# Patient Record
Sex: Male | Born: 1965 | Race: White | Hispanic: No | Marital: Married | Smoking: Never smoker
Health system: Southern US, Community
[De-identification: ages and names within clinical notes are randomized; demographics above are authoritative.]

## PROBLEM LIST (undated history)

## (undated) DIAGNOSIS — M069 Rheumatoid arthritis, unspecified: Secondary | ICD-10-CM

## (undated) DIAGNOSIS — H9190 Unspecified hearing loss, unspecified ear: Secondary | ICD-10-CM

## (undated) DIAGNOSIS — E079 Disorder of thyroid, unspecified: Secondary | ICD-10-CM

## (undated) DIAGNOSIS — R21 Rash and other nonspecific skin eruption: Secondary | ICD-10-CM

## (undated) DIAGNOSIS — E7800 Pure hypercholesterolemia, unspecified: Secondary | ICD-10-CM

## (undated) HISTORY — DX: Rheumatoid arthritis, unspecified: M06.9

## (undated) HISTORY — PX: PROSTATE BIOPSY: SHX241

## (undated) HISTORY — DX: Disorder of thyroid, unspecified: E07.9

## (undated) HISTORY — DX: Pure hypercholesterolemia, unspecified: E78.00

## (undated) HISTORY — DX: Unspecified hearing loss, unspecified ear: H91.90

## (undated) HISTORY — DX: Rash and other nonspecific skin eruption: R21

---

## 2011-07-27 ENCOUNTER — Other Ambulatory Visit: Payer: Self-pay | Admitting: Family Medicine

## 2011-07-27 DIAGNOSIS — E049 Nontoxic goiter, unspecified: Secondary | ICD-10-CM

## 2011-07-30 ENCOUNTER — Ambulatory Visit
Admission: RE | Admit: 2011-07-30 | Discharge: 2011-07-30 | Disposition: A | Discharge status: Home / Self Care | Payer: BC Managed Care – PPO | Source: Ambulatory Visit | Attending: Family Medicine | Admitting: Family Medicine

## 2011-07-30 DIAGNOSIS — E049 Nontoxic goiter, unspecified: Secondary | ICD-10-CM

## 2011-08-04 ENCOUNTER — Other Ambulatory Visit: Payer: Self-pay | Admitting: Family Medicine

## 2011-08-04 DIAGNOSIS — E041 Nontoxic single thyroid nodule: Secondary | ICD-10-CM

## 2011-08-12 ENCOUNTER — Ambulatory Visit
Admission: RE | Admit: 2011-08-12 | Discharge: 2011-08-12 | Disposition: A | Discharge status: Home / Self Care | Payer: BC Managed Care – PPO | Source: Ambulatory Visit | Attending: Family Medicine | Admitting: Family Medicine

## 2011-08-12 ENCOUNTER — Other Ambulatory Visit (HOSPITAL_COMMUNITY)
Admission: RE | Admit: 2011-08-12 | Discharge: 2011-08-12 | Disposition: A | Discharge status: Home / Self Care | Payer: BC Managed Care – PPO | Source: Ambulatory Visit | Attending: Radiology | Admitting: Radiology

## 2011-08-12 DIAGNOSIS — E041 Nontoxic single thyroid nodule: Secondary | ICD-10-CM

## 2011-08-12 DIAGNOSIS — E049 Nontoxic goiter, unspecified: Secondary | ICD-10-CM | POA: Insufficient documentation

## 2011-08-12 NOTE — Procedures (Signed)
US guided needle aspirate biopsy performed of dominant left thyroid nodule x4 via 25 gauge needles. No immediate complications. Pathology pending.

## 2011-09-01 ENCOUNTER — Encounter (INDEPENDENT_AMBULATORY_CARE_PROVIDER_SITE_OTHER): Payer: Self-pay | Admitting: Surgery

## 2011-09-13 ENCOUNTER — Encounter (INDEPENDENT_AMBULATORY_CARE_PROVIDER_SITE_OTHER): Payer: Self-pay | Admitting: Surgery

## 2011-09-13 ENCOUNTER — Ambulatory Visit (INDEPENDENT_AMBULATORY_CARE_PROVIDER_SITE_OTHER): Payer: BC Managed Care – PPO | Admitting: Surgery

## 2011-09-13 VITALS — BP 122/88 | HR 66 | Temp 97.2°F | Ht 74.0 in | Wt 205.0 lb

## 2011-09-13 DIAGNOSIS — E041 Nontoxic single thyroid nodule: Secondary | ICD-10-CM

## 2011-09-13 NOTE — Patient Instructions (Signed)
Call if you decide to schedule surgery to remove your thyroid gland or if you have any other questions

## 2011-09-13 NOTE — Progress Notes (Signed)
NAME: Christian Gentry                                                                                      DOB: 07/19/1966 DATE: 09/13/2011               MRN: [id number]   CC:  Chief Complaint  Patient presents with  . Thyroid Nodule    eval thyroid nodule    HPI:  Christian Collen Vincent is a 45 y.o.  male who was referred  by Dr. Coralyn Helling evaluation of A. 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 The patient has been asymptomatic. His sister apparently had a thyroid nodule removed surgically and with his mother was here to visit noted the patient had a mass in the left lobe of the thyroid. The patient was unaware of that and has had no symptoms. He had an ultrasound done showing a complex mass and fine-needle aspiration biopsy showing what appears to be a follicular tumor although could be Hurthle cell and also could have a papillary thyroid cancer. However most likely thought to be a benign follicular tumor.  PMH:  has a past medical history of Hypercholesteremia; Thyroid disease; Rash; and Hearing difficulty.  PSH:   has no past surgical history on file.  ALLERGIES:  No Known Allergies  MEDICATIONS: No current outpatient prescriptions on file.  ROS: This 12 point review of systems basically negative except for a rash he had a few weeks ago and some hearing here congestion but has not resolved. He also is known to have high cholesterol.  EXAM:   GENERAL:  The patient is alert, oriented, and generally healthy-appearing, NAD. Mood and affect are normal.  HEENT:  The head is normocephalic, the eyes nonicteric, the pupils were round regular and equal. EOMs are normal. Pharynx normal. Dentition good.  NECK:  The neck is supple.There is a 3 cm round firm but not hard mass of the left lobe of the thyroid gland. It is nontender. It moves easily when he swallows. The remainder of the thyroid gland is normal. There is no cervical adenopathy noted.  LUNGS:   Normal respirations and clear to auscultation.  HEART:  Regular rhythm, with no murmurs rubs or gallops. Pulses are intact carotid dorsalis pedis and posterior tibial. No significant varicosities are noted.  ABDOMEN:  Soft, flat, and nontender. No masses or organomegaly is noted. No hernias are noted. Bowel sounds are normal.    EXTREMITIES:  Good range of motion, no edema.   DATA REVIEWED:  I have reviewed the thyroid ultrasound and report as well as his pathology report.  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 I recommended that he consider thyroid lobectomy. We cannot be sure it is not malignant although it is more likely to be benign. However the indications for surgery risks and complications including bleeding, infection, nerve damage, and loss of parathyroid function were all explained. I explained that he would probably be in the hospital overnight and that should be able to  return to normal activities within a few weeks.  I told an alternative would be to do a 4-6 month followup ultrasound and continue interval ultrasound exams to be sure this is not enlarging. I also told him that if this were malignant he would most likely need a total thyroidectomy and possibly radioactive iodine therapy.  I think all questions have been answered. He and his wife wanted to think over the issues before making a decision. I told him to call for any questions or if they wished to schedule surgery.  Ashton Sabine J 09/13/2011  CC: Halina Maidens., MD, Warrick Parisian, MD

## 2011-09-15 ENCOUNTER — Other Ambulatory Visit (INDEPENDENT_AMBULATORY_CARE_PROVIDER_SITE_OTHER): Payer: Self-pay | Admitting: Surgery

## 2011-09-15 ENCOUNTER — Telehealth (INDEPENDENT_AMBULATORY_CARE_PROVIDER_SITE_OTHER): Payer: Self-pay | Admitting: Surgery

## 2011-09-16 NOTE — Telephone Encounter (Signed)
Have you written orders on her?

## 2011-09-17 NOTE — Telephone Encounter (Signed)
Per Dr Jamey Ripa orders have been written and sent to the scheduling department.

## 2011-09-21 ENCOUNTER — Encounter (INDEPENDENT_AMBULATORY_CARE_PROVIDER_SITE_OTHER): Payer: Self-pay

## 2011-10-07 ENCOUNTER — Encounter (HOSPITAL_COMMUNITY): Payer: Self-pay

## 2011-10-15 ENCOUNTER — Ambulatory Visit (HOSPITAL_COMMUNITY)
Admission: RE | Admit: 2011-10-15 | Discharge: 2011-10-15 | Disposition: A | Discharge status: Home / Self Care | Payer: BC Managed Care – PPO | Source: Ambulatory Visit | Attending: Surgery | Admitting: Surgery

## 2011-10-15 ENCOUNTER — Encounter (HOSPITAL_COMMUNITY)
Admission: RE | Admit: 2011-10-15 | Discharge: 2011-10-15 | Disposition: A | Discharge status: Home / Self Care | Payer: BC Managed Care – PPO | Source: Ambulatory Visit | Attending: Surgery | Admitting: Surgery

## 2011-10-15 ENCOUNTER — Encounter (HOSPITAL_COMMUNITY): Payer: Self-pay

## 2011-10-15 DIAGNOSIS — E041 Nontoxic single thyroid nodule: Secondary | ICD-10-CM | POA: Insufficient documentation

## 2011-10-15 DIAGNOSIS — I10 Essential (primary) hypertension: Secondary | ICD-10-CM | POA: Insufficient documentation

## 2011-10-15 DIAGNOSIS — Z01818 Encounter for other preprocedural examination: Secondary | ICD-10-CM | POA: Insufficient documentation

## 2011-10-15 DIAGNOSIS — Z01812 Encounter for preprocedural laboratory examination: Secondary | ICD-10-CM | POA: Insufficient documentation

## 2011-10-15 LAB — SURGICAL PCR SCREEN
MRSA, PCR: NEGATIVE
Staphylococcus aureus: POSITIVE — AB

## 2011-10-15 LAB — CBC
MCH: 29.4 pg (ref 26.0–34.0)
Platelets: 184 10*3/uL (ref 150–400)
RBC: 4.93 MIL/uL (ref 4.22–5.81)

## 2011-10-15 NOTE — Patient Instructions (Signed)
20 Jaap-Jan Sargon Scouten  10/15/2011   Your procedure is scheduled on:  10/21/11 1130am-100pm  Report to [facility] at 0930 AM.  Call this number if you have problems the morning of surgery: [phone]   Remember:   Do not eat food:After Midnight.  May have clear liquids:until Midnight .    Take these medicines the morning of surgery with A SIP OF WATER:    Do not wear jewelry,   Do not wear lotions, powders, or perfumes.     Do not bring valuables to the hospital.  Contacts, dentures or bridgework may not be worn into surgery.  Leave suitcase in the car. After surgery it may be brought to your room.  For patients admitted to the hospital, checkout time is 11:00 AM the day of discharge.   Marland Kitchen    Special Instructions: CHG Shower Use Special Wash: 1/2 bottle night before surgery and 1/2 bottle morning of surgery. shower chin to toes Wash face and private parts with regular soap.     Please read over the following fact sheets that you were given: MRSA Information, coughing and deep breathing exercises, leg exercises

## 2011-10-21 ENCOUNTER — Encounter (HOSPITAL_COMMUNITY): Payer: Self-pay | Admitting: Anesthesiology

## 2011-10-21 ENCOUNTER — Ambulatory Visit (HOSPITAL_COMMUNITY): Payer: BC Managed Care – PPO | Admitting: Anesthesiology

## 2011-10-21 ENCOUNTER — Encounter (HOSPITAL_COMMUNITY)
Admission: RE | Disposition: A | Discharge status: Home / Self Care | Payer: Self-pay | Source: Ambulatory Visit | Attending: Surgery

## 2011-10-21 ENCOUNTER — Ambulatory Visit (HOSPITAL_COMMUNITY)
Admission: RE | Admit: 2011-10-21 | Discharge: 2011-10-22 | Disposition: A | Discharge status: Home / Self Care | Payer: BC Managed Care – PPO | Source: Ambulatory Visit | Attending: Surgery | Admitting: Surgery

## 2011-10-21 ENCOUNTER — Encounter (HOSPITAL_COMMUNITY): Payer: Self-pay | Admitting: *Deleted

## 2011-10-21 ENCOUNTER — Other Ambulatory Visit (INDEPENDENT_AMBULATORY_CARE_PROVIDER_SITE_OTHER): Payer: Self-pay | Admitting: Surgery

## 2011-10-21 DIAGNOSIS — E041 Nontoxic single thyroid nodule: Secondary | ICD-10-CM | POA: Insufficient documentation

## 2011-10-21 DIAGNOSIS — D34 Benign neoplasm of thyroid gland: Secondary | ICD-10-CM

## 2011-10-21 DIAGNOSIS — E7800 Pure hypercholesterolemia, unspecified: Secondary | ICD-10-CM | POA: Insufficient documentation

## 2011-10-21 DIAGNOSIS — Z01812 Encounter for preprocedural laboratory examination: Secondary | ICD-10-CM | POA: Insufficient documentation

## 2011-10-21 DIAGNOSIS — R21 Rash and other nonspecific skin eruption: Secondary | ICD-10-CM | POA: Insufficient documentation

## 2011-10-21 DIAGNOSIS — H9190 Unspecified hearing loss, unspecified ear: Secondary | ICD-10-CM | POA: Insufficient documentation

## 2011-10-21 HISTORY — PX: THYROID LOBECTOMY: SHX420

## 2011-10-21 SURGERY — LOBECTOMY, THYROID
Anesthesia: General | Site: Abdomen | Laterality: Left | Wound class: Clean

## 2011-10-21 MED ORDER — MEPERIDINE HCL 50 MG/ML IJ SOLN: 6.2500 mg | INTRAMUSCULAR | Status: DC | PRN

## 2011-10-21 MED ORDER — GLYCOPYRROLATE 0.2 MG/ML IJ SOLN
INTRAMUSCULAR | Status: DC | PRN
  Administered 2011-10-21: 0.1 mg via INTRAVENOUS

## 2011-10-21 MED ORDER — MIDAZOLAM HCL 5 MG/5ML IJ SOLN
INTRAMUSCULAR | Status: DC | PRN
  Administered 2011-10-21: 2 mg via INTRAVENOUS

## 2011-10-21 MED ORDER — HYDROMORPHONE HCL PF 1 MG/ML IJ SOLN
2.0000 mg | INTRAMUSCULAR | Status: DC | PRN
Start: 2011-10-21 — End: 2011-10-22
  Administered 2011-10-21: 2 mg via INTRAVENOUS
  Filled 2011-10-21: qty 1

## 2011-10-21 MED ORDER — LACTATED RINGERS IV SOLN
INTRAVENOUS | Status: DC
  Administered 2011-10-21: 1000 mL via INTRAVENOUS

## 2011-10-21 MED ORDER — DEXAMETHASONE SODIUM PHOSPHATE 10 MG/ML IJ SOLN
INTRAMUSCULAR | Status: DC | PRN
  Administered 2011-10-21: 10 mg via INTRAVENOUS

## 2011-10-21 MED ORDER — SCOPOLAMINE 1 MG/3DAYS TD PT72
MEDICATED_PATCH | TRANSDERMAL | Status: DC | PRN
  Administered 2011-10-21: 1 via TRANSDERMAL

## 2011-10-21 MED ORDER — SUCCINYLCHOLINE CHLORIDE 20 MG/ML IJ SOLN
INTRAMUSCULAR | Status: DC | PRN
  Administered 2011-10-21: 100 mg via INTRAVENOUS

## 2011-10-21 MED ORDER — FENTANYL CITRATE 0.05 MG/ML IJ SOLN
INTRAMUSCULAR | Status: DC | PRN
  Administered 2011-10-21: 25 ug via INTRAVENOUS
  Administered 2011-10-21: 50 ug via INTRAVENOUS
  Administered 2011-10-21: 25 ug via INTRAVENOUS
  Administered 2011-10-21 (×2): 50 ug via INTRAVENOUS

## 2011-10-21 MED ORDER — OXYCODONE-ACETAMINOPHEN 5-325 MG PO TABS
1.0000 | ORAL_TABLET | ORAL | Status: DC | PRN
  Administered 2011-10-22: 2 via ORAL
  Filled 2011-10-21: qty 2

## 2011-10-21 MED ORDER — PROMETHAZINE HCL 25 MG/ML IJ SOLN: 6.2500 mg | INTRAMUSCULAR | Status: DC | PRN

## 2011-10-21 MED ORDER — ONDANSETRON HCL 4 MG PO TABS: 4.0000 mg | ORAL_TABLET | Freq: Four times a day (QID) | ORAL | Status: DC | PRN

## 2011-10-21 MED ORDER — ACETAMINOPHEN 10 MG/ML IV SOLN
INTRAVENOUS | Status: DC | PRN
  Administered 2011-10-21: 1000 mg via INTRAVENOUS

## 2011-10-21 MED ORDER — KCL IN DEXTROSE-NACL 20-5-0.45 MEQ/L-%-% IV SOLN
INTRAVENOUS | Status: DC
  Administered 2011-10-21: 20:00:00 via INTRAVENOUS
  Filled 2011-10-21 (×4): qty 1000

## 2011-10-21 MED ORDER — ONDANSETRON HCL 4 MG/2ML IJ SOLN: 4.0000 mg | Freq: Four times a day (QID) | INTRAMUSCULAR | Status: DC | PRN

## 2011-10-21 MED ORDER — PROPOFOL 10 MG/ML IV EMUL
INTRAVENOUS | Status: DC | PRN
  Administered 2011-10-21: 200 mg via INTRAVENOUS

## 2011-10-21 MED ORDER — DROPERIDOL 2.5 MG/ML IJ SOLN
INTRAMUSCULAR | Status: DC | PRN
  Administered 2011-10-21: 0.625 mg via INTRAVENOUS

## 2011-10-21 MED ORDER — LACTATED RINGERS IV SOLN: INTRAVENOUS | Status: DC

## 2011-10-21 MED ORDER — CEFAZOLIN SODIUM-DEXTROSE 2-3 GM-% IV SOLR
2.0000 g | INTRAVENOUS | Status: AC
  Administered 2011-10-21: 2 g via INTRAVENOUS

## 2011-10-21 MED ORDER — HYDROMORPHONE HCL PF 1 MG/ML IJ SOLN: 0.2500 mg | INTRAMUSCULAR | Status: DC | PRN

## 2011-10-21 SURGICAL SUPPLY — 43 items
ATTRACTOMAT 16X20 MAGNETIC DRP (DRAPES) ×2 IMPLANT
BENZOIN TINCTURE PRP APPL 2/3 (GAUZE/BANDAGES/DRESSINGS) ×2 IMPLANT
BLADE HEX COATED 2.75 (ELECTRODE) ×2 IMPLANT
BLADE SURG 15 STRL LF DISP TIS (BLADE) ×2 IMPLANT
BLADE SURG 15 STRL SS (BLADE) ×2
CANISTER SUCTION 2500CC (MISCELLANEOUS) ×2 IMPLANT
CHLORAPREP W/TINT 10.5 ML (MISCELLANEOUS) ×2 IMPLANT
CLIP TI LARGE 6 (CLIP) ×6 IMPLANT
CLIP TI WIDE RED SMALL 6 (CLIP) ×4 IMPLANT
CLOTH BEACON ORANGE TIMEOUT ST (SAFETY) ×2 IMPLANT
DISSECTOR ROUND CHERRY 3/8 STR (MISCELLANEOUS) ×2 IMPLANT
DRAIN PENROSE 18X1/4 LTX STRL (WOUND CARE) IMPLANT
DRAPE PED LAPAROTOMY (DRAPES) ×2 IMPLANT
ELECT REM PT RETURN 9FT ADLT (ELECTROSURGICAL) ×2
ELECTRODE REM PT RTRN 9FT ADLT (ELECTROSURGICAL) ×1 IMPLANT
GAUZE SPONGE 4X4 12PLY STRL LF (GAUZE/BANDAGES/DRESSINGS) ×2 IMPLANT
GAUZE SPONGE 4X4 16PLY XRAY LF (GAUZE/BANDAGES/DRESSINGS) ×2 IMPLANT
GLOVE BIOGEL PI IND STRL 7.0 (GLOVE) ×1 IMPLANT
GLOVE BIOGEL PI INDICATOR 7.0 (GLOVE) ×1
GLOVE EUDERMIC 7 POWDERFREE (GLOVE) ×2 IMPLANT
GOWN STRL NON-REIN LRG LVL3 (GOWN DISPOSABLE) ×2 IMPLANT
GOWN STRL REIN XL XLG (GOWN DISPOSABLE) ×4 IMPLANT
HEMOSTAT SURGICEL 2X14 (HEMOSTASIS) IMPLANT
HEMOSTAT SURGICEL 2X4 FIBR (HEMOSTASIS) ×2 IMPLANT
KIT BASIN OR (CUSTOM PROCEDURE TRAY) ×2 IMPLANT
NS IRRIG 1000ML POUR BTL (IV SOLUTION) ×2 IMPLANT
PACK BASIC VI WITH GOWN DISP (CUSTOM PROCEDURE TRAY) ×2 IMPLANT
PENCIL BUTTON HOLSTER BLD 10FT (ELECTRODE) ×2 IMPLANT
SHEARS HARMONIC 9CM CVD (BLADE) ×2 IMPLANT
SPONGE GAUZE 4X4 12PLY (GAUZE/BANDAGES/DRESSINGS) ×2 IMPLANT
STAPLER VISISTAT 35W (STAPLE) ×2 IMPLANT
STRIP CLOSURE SKIN 1/2X4 (GAUZE/BANDAGES/DRESSINGS) ×2 IMPLANT
SUT MNCRL AB 4-0 PS2 18 (SUTURE) ×2 IMPLANT
SUT SILK 2 0 (SUTURE) ×1
SUT SILK 2-0 18XBRD TIE 12 (SUTURE) ×1 IMPLANT
SUT SILK 3 0 (SUTURE) ×1
SUT SILK 3-0 18XBRD TIE 12 (SUTURE) ×1 IMPLANT
SUT VIC AB 3-0 SH 18 (SUTURE) ×4 IMPLANT
SYR BULB IRRIGATION 50ML (SYRINGE) ×2 IMPLANT
TAPE CLOTH SURG 4X10 WHT LF (GAUZE/BANDAGES/DRESSINGS) ×2 IMPLANT
TOWEL OR 17X26 10 PK STRL BLUE (TOWEL DISPOSABLE) ×2 IMPLANT
WATER STERILE IRR 1500ML POUR (IV SOLUTION) ×2 IMPLANT
YANKAUER SUCT BULB TIP 10FT TU (MISCELLANEOUS) ×2 IMPLANT

## 2011-10-21 NOTE — H&P (Signed)
NAME: Christian Gentry DOB: 1965-09-18  DATE: 09/13/2011 MRN: [id number]  CC:  Chief Complaint Thyroid nodule           HPI: Christian Gentry is a 46 y.o. male who was referred by Dr. Coralyn Helling evaluation of A. 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 The patient has been asymptomatic. His sister apparently had a thyroid nodule removed surgically and with his mother was here to visit noted the patient had a mass in the left lobe of the thyroid. The patient was unaware of that and has had no symptoms. He had an ultrasound done showing a complex mass and fine-needle aspiration biopsy showing what appears to be a follicular tumor although could be Hurthle cell and also could have a papillary thyroid cancer. However most likely thought to be a benign follicular tumor.  PMH: has a past medical history of Hypercholesteremia; Thyroid disease; Rash; and Hearing difficulty.  PSH: has no past surgical history on file.  ALLERGIES: No Known Allergies  MEDICATIONS: No current outpatient prescriptions on file.  ROS: This 12 point review of systems basically negative except for a rash he had a few weeks ago and some hearing here congestion but has not resolved. He also is known to have high cholesterol.  EXAM:  BP 124/81  Pulse 72  Temp 98.4 F (36.9 C)  Resp 20  SpO2 99%  GENERAL:  The patient is alert, oriented, and generally healthy-appearing, NAD. Mood and affect are normal.  HEENT:  The head is normocephalic, the eyes nonicteric, the pupils were round regular and equal. EOMs are normal. Pharynx normal. Dentition good.  NECK:  The neck is supple.There is a 3 cm round firm but not hard mass of the left lobe of the thyroid gland. It is nontender. It moves easily when he swallows. The remainder of the thyroid gland is normal. There is no cervical adenopathy noted.  LUNGS:  Normal respirations and clear to auscultation.  HEART:  Regular rhythm, with no murmurs rubs  or gallops. Pulses are intact carotid dorsalis pedis and posterior tibial. No significant varicosities are noted.  ABDOMEN:  Soft, flat, and nontender. No masses or organomegaly is noted. No hernias are noted. Bowel sounds are normal.  EXTREMITIES:  Good range of motion, no edema.  DATA REVIEWED: I have reviewed the thyroid ultrasound and report as well as his pathology report.  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 I have reviewed the plans with him again and answered questions.

## 2011-10-21 NOTE — Transfer of Care (Signed)
Immediate Anesthesia Transfer of Care Note  Patient: Christian Gentry  Procedure(s) Performed:  THYROID LOBECTOMY - Removal left lobe of thyroid  Patient Location: PACU  Anesthesia Type: General  Level of Consciousness: sedated, patient cooperative and responds to stimulaton  Airway & Oxygen Therapy: Patient Spontanous Breathing and Patient connected to face mask oxgen  Post-op Assessment: Report given to PACU RN and Post -op Vital signs reviewed and stable  Post vital signs: Reviewed and stable  Complications: No apparent anesthesia complications

## 2011-10-21 NOTE — Anesthesia Procedure Notes (Signed)
Performed by: Arion Shankles JOHNSON     

## 2011-10-21 NOTE — Op Note (Signed)
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 Viviann Spare gross  Anesthesia: General   Clinical History and Indications: This patient was found to have a nodule of the left lobe of the thyroid and on fine needle aspiration biopsy it appeared to be a follicular lesion. After discussion of alternatives he elected to proceed to left thyroid lobectomy. We discussed the fact that he might have to have a total thyroidectomy at some point if his final pathology showed carcinoma of the thyroid.    Description of Procedure: I saw the patient in the preoperative area and reviewed the plans with him again. I examined him as well. There is a palpable nodule in the left lobe of the thyroid.  The patient was taken to the operating room and after satisfactory general anesthesia had been obtained the neck area was prepped and draped in the time out was done. A transverse cervical incision was made and deepened through the platysma muscle. Subplatysmal flaps were raised in the usual fashion and a self-retaining retractor placed.  The midline fascia was opened and the left side strap muscles elevated off of the thyroid gland. Some small vessels coming to the inferior pole medially were divided. The superior pole vessels were then individually dissected out and ligated either with ties or clips. This gave good mobility of the thyroid gland slightly rotated medially.  Small vessels as they entered the thyroid were divided either using the harmonic scalpel or clips. I stayed in the surgical plane of the thyroid and away from the nerve. I saw an inferior parathyroid gland and what I thought was a superior parathyroid gland both of which were preserved. I was able then to free the thyroid off of the  trachea and rotated medially and beyond the isthmus over to the right side. The thyroid gland was then divided at the isthmus with the harmonic and passed off as a specimen.  I then irrigated and made sure everything was dry. I had not initially seen and nervous I thought it was under some fatty tissue and dissected in their little bit to identify it and be certain that it was intact. We did see it and there is no evidence of any injury.  I then closed in layers with 3-0 Vicryl to close the fascia and platysma and 4-0 Monocryl subcuticular and Steri-Strips for the skin.  The patient tolerated the procedure well. There were no operative complications. All counts were correct. Currie Paris, MD, FACS 10/21/2011 2:36 PM

## 2011-10-21 NOTE — Anesthesia Preprocedure Evaluation (Signed)
Anesthesia Evaluation  Patient identified by MRN, date of birth, ID band Patient awake    Reviewed: Allergy & Precautions, H&P , NPO status , Patient's Chart, lab work & pertinent test results  Airway Mallampati: II TM Distance: >3 FB Neck ROM: Full    Dental No notable dental hx.    Pulmonary neg pulmonary ROS,  clear to auscultation  Pulmonary exam normal       Cardiovascular neg cardio ROS Regular Normal    Neuro/Psych Negative Neurological ROS  Negative Psych ROS   GI/Hepatic negative GI ROS, Neg liver ROS,   Endo/Other  Negative Endocrine ROS  Renal/GU negative Renal ROS  Genitourinary negative   Musculoskeletal negative musculoskeletal ROS (+)   Abdominal   Peds negative pediatric ROS (+)  Hematology negative hematology ROS (+)   Anesthesia Other Findings   Reproductive/Obstetrics negative OB ROS                          Anesthesia Physical Anesthesia Plan  ASA: I  Anesthesia Plan: General   Post-op Pain Management:    Induction: Intravenous  Airway Management Planned: Oral ETT  Additional Equipment:   Intra-op Plan:   Post-operative Plan: Extubation in OR  Informed Consent: I have reviewed the patients History and Physical, chart, labs and discussed the procedure including the risks, benefits and alternatives for the proposed anesthesia with the patient or authorized representative who has indicated his/her understanding and acceptance.   Dental advisory given  Plan Discussed with: CRNA  Anesthesia Plan Comments:         Anesthesia Quick Evaluation  

## 2011-10-21 NOTE — Anesthesia Postprocedure Evaluation (Signed)
  Anesthesia Post-op Note  Patient: Christian Gentry  Procedure(s) Performed:  THYROID LOBECTOMY - Removal left lobe of thyroid  Patient Location: PACU  Anesthesia Type: General  Level of Consciousness: awake and alert   Airway and Oxygen Therapy: Patient Spontanous Breathing  Post-op Pain: mild  Post-op Assessment: Post-op Vital signs reviewed, Patient's Cardiovascular Status Stable, Respiratory Function Stable, Patent Airway and No signs of Nausea or vomiting  Post-op Vital Signs: stable  Complications: No apparent anesthesia complications

## 2011-10-22 MED ORDER — HYDROCODONE-ACETAMINOPHEN 7.5-325 MG/15ML PO SOLN: 15.0000 mL | ORAL | Status: AC | PRN

## 2011-10-22 NOTE — Progress Notes (Signed)
Patient ID: Christian Gentry, male   DOB: Dec 13, 1965, 46 y.o.   MRN: [id number] 1 Day Post-Op  Subjective: Feels OK, mild sore throat voice OK  Objective: Vital signs in last 24 hours: Temp:  [96.9 F (36.1 C)-98.8 F (37.1 C)] 98.8 F (37.1 C) (02/08 0540) Pulse Rate:  [55-72] 60  (02/08 0540) Resp:  [13-20] 16  (02/08 0540) BP: (110-136)/(63-91) 110/63 mmHg (02/08 0540) SpO2:  [97 %-100 %] 99 % (02/08 0540) Weight:  [203 lb (92.08 kg)] 203 lb (92.08 kg) (02/07 2105)   Intake/Output from previous day: 02/07 0701 - 02/08 0700 In: 800 [I.V.:800] Out: -  Intake/Output this shift:     General appearance: alert and no distress  Incision: Clean and dry  Lab Results:  No results found for this basename: WBC:2,HGB:2,HCT:2,PLT:2 in the last 72 hours BMET No results found for this basename: NA:2,K:2,CL:2,CO2:2,GLUCOSE:2,BUN:2,CREATININE:2,CALCIUM:2 in the last 72 hours PT/INR No results found for this basename: LABPROT:2,INR:2 in the last 72 hours ABG No results found for this basename: PHART:2,PCO2:2,PO2:2,HCO3:2 in the last 72 hours  MEDS, Scheduled    .  ceFAZolin (ANCEF) IV  2 g Intravenous 60 min Pre-Op    Studies/Results: No results found.  Assessment: s/p Procedure(s): THYROID LOBECTOMY. LEFT Ready to discharge  Plan: Discharge   LOS: 1 day     Currie Paris, MD, [facility], [place] [phone]   10/22/2011 7:21 AM

## 2011-10-22 NOTE — Progress Notes (Signed)
Patient being discharged in stable condition; Discharge instructions and scripts given to pt; Pt verbalized understanding

## 2011-10-26 ENCOUNTER — Telehealth (INDEPENDENT_AMBULATORY_CARE_PROVIDER_SITE_OTHER): Payer: Self-pay | Admitting: General Surgery

## 2011-10-26 NOTE — Telephone Encounter (Signed)
Christian Gentry CALLED RE HIS PATHOLOGY REPORT AND F/U APPT WITH Christian Gentry/ I REVIEWED PATHOLOGY WITH PATIENT AND SCHEDULED FOLLOW-UP APPT ON 11-04-11 WITH Christian Gentry

## 2011-11-04 ENCOUNTER — Ambulatory Visit (INDEPENDENT_AMBULATORY_CARE_PROVIDER_SITE_OTHER): Payer: BC Managed Care – PPO | Admitting: Surgery

## 2011-11-04 ENCOUNTER — Encounter (INDEPENDENT_AMBULATORY_CARE_PROVIDER_SITE_OTHER): Payer: Self-pay | Admitting: Surgery

## 2011-11-04 VITALS — BP 114/88 | HR 64 | Temp 97.4°F | Resp 12 | Ht 74.0 in | Wt 201.2 lb

## 2011-11-04 DIAGNOSIS — E041 Nontoxic single thyroid nodule: Secondary | ICD-10-CM

## 2011-11-04 DIAGNOSIS — Z9889 Other specified postprocedural states: Secondary | ICD-10-CM

## 2011-11-04 NOTE — Patient Instructions (Addendum)
We will see you again on an as needed basis. Please call the office at [phone] if you have any questions or concerns. Thank you for allowing [place] to take care of you.  You should have your thyroid function blood tests re-checked by your primary care physician in about four months

## 2011-11-04 NOTE — Progress Notes (Signed)
NAME: Christian Gentry                                            DOB: 08-27-1966 DATE: 11/04/2011                                                  MRN: [id number]  CC: Post op   HPI: This patient comes in for post op follow-up.Heunderwent left thryoid lobectomy on 10/11/2011. He feels that he is doing well.No voice issues or swallowing problems  PE: General: The patient appears to be healthy, NAD Incision healing  DATA REVIEWED: Path noted and patient given a copy  IMPRESSION: The patient is doing well S/P thyroid lobectomy.    PLAN: RTC PRN. Told him to have thyroid functions checked in about four months

## 2011-11-15 ENCOUNTER — Encounter (HOSPITAL_COMMUNITY): Payer: Self-pay | Admitting: Surgery

## 2016-01-20 ENCOUNTER — Other Ambulatory Visit (HOSPITAL_COMMUNITY): Payer: Self-pay | Admitting: Rheumatology

## 2016-01-20 ENCOUNTER — Ambulatory Visit (HOSPITAL_COMMUNITY)
Admission: RE | Admit: 2016-01-20 | Discharge: 2016-01-20 | Disposition: A | Discharge status: Home / Self Care | Payer: BC Managed Care – PPO | Source: Ambulatory Visit | Attending: Rheumatology | Admitting: Rheumatology

## 2016-01-20 DIAGNOSIS — T451X5A Adverse effect of antineoplastic and immunosuppressive drugs, initial encounter: Secondary | ICD-10-CM

## 2016-01-20 DIAGNOSIS — Z139 Encounter for screening, unspecified: Secondary | ICD-10-CM | POA: Insufficient documentation

## 2016-01-28 ENCOUNTER — Encounter: Payer: Self-pay | Admitting: Hematology & Oncology

## 2016-02-05 ENCOUNTER — Ambulatory Visit (HOSPITAL_BASED_OUTPATIENT_CLINIC_OR_DEPARTMENT_OTHER): Payer: BC Managed Care – PPO | Admitting: Family

## 2016-02-05 ENCOUNTER — Other Ambulatory Visit (HOSPITAL_BASED_OUTPATIENT_CLINIC_OR_DEPARTMENT_OTHER): Payer: BC Managed Care – PPO

## 2016-02-05 ENCOUNTER — Encounter: Payer: Self-pay | Admitting: Family

## 2016-02-05 ENCOUNTER — Other Ambulatory Visit: Payer: Self-pay | Admitting: Family

## 2016-02-05 ENCOUNTER — Ambulatory Visit: Payer: BC Managed Care – PPO

## 2016-02-05 VITALS — BP 121/78 | HR 73 | Temp 98.2°F | Resp 14 | Ht 74.0 in | Wt 196.0 lb

## 2016-02-05 DIAGNOSIS — Z801 Family history of malignant neoplasm of trachea, bronchus and lung: Secondary | ICD-10-CM | POA: Diagnosis not present

## 2016-02-05 DIAGNOSIS — D72819 Decreased white blood cell count, unspecified: Secondary | ICD-10-CM

## 2016-02-05 DIAGNOSIS — Z8042 Family history of malignant neoplasm of prostate: Secondary | ICD-10-CM | POA: Diagnosis not present

## 2016-02-05 DIAGNOSIS — M069 Rheumatoid arthritis, unspecified: Secondary | ICD-10-CM

## 2016-02-05 LAB — CBC WITH DIFFERENTIAL (CANCER CENTER ONLY)
BASO#: 0 10*3/uL (ref 0.0–0.2)
BASO%: 0.8 % (ref 0.0–2.0)
EOS%: 1.6 % (ref 0.0–7.0)
Eosinophils Absolute: 0.1 10*3/uL (ref 0.0–0.5)
HCT: 36.6 % — ABNORMAL LOW (ref 38.7–49.9)
HGB: 12.1 g/dL — ABNORMAL LOW (ref 13.0–17.1)
LYMPH#: 0.9 10*3/uL (ref 0.9–3.3)
LYMPH%: 23 % (ref 14.0–48.0)
MCH: 26.9 pg — ABNORMAL LOW (ref 28.0–33.4)
MCHC: 33.1 g/dL (ref 32.0–35.9)
MCV: 81 fL — ABNORMAL LOW (ref 82–98)
MONO#: 0.6 10*3/uL (ref 0.1–0.9)
MONO%: 15 % — ABNORMAL HIGH (ref 0.0–13.0)
NEUT#: 2.3 10*3/uL (ref 1.5–6.5)
NEUT%: 59.6 % (ref 40.0–80.0)
PLATELETS: 227 10*3/uL (ref 145–400)
RBC: 4.5 10*6/uL (ref 4.20–5.70)
RDW: 15 % (ref 11.1–15.7)
WBC: 3.8 10*3/uL — ABNORMAL LOW (ref 4.0–10.0)

## 2016-02-05 LAB — CHCC SATELLITE - SMEAR

## 2016-02-05 LAB — TECHNOLOGIST REVIEW CHCC SATELLITE

## 2016-02-05 NOTE — Progress Notes (Signed)
Hematology/Oncology Consultation   Name: Christian Gentry      MRN: [id number]    Location: Room/bed info not found  Date: 02/05/2016 Time:11:41 AM   REFERRING PHYSICIAN: Bo Merino, MD  REASON FOR CONSULT: Decreased WBC    DIAGNOSIS: Leukopenia   HISTORY OF PRESENT ILLNESS: Mr. Christian Gentry is a very pleasant 50 yo male originally from the [place] with a recent WBC count of 1.8. Today's blood work shows a WBC count of 3.8. He has had no problem with infections.  He is currently being followed by Rheumatology for rheumatoid arthritis. He is on a Prednisone taper and they would like him to start treatment with Methotrexate. He has a great deal of joint pain in his hands, arms and legs. This has also caused him difficulty sleeping.  He has no family history of leukopenia.  No personal cancer history. Family cancer history includes: Father - prostate and lung.  No fever, chills, n/v, cough, rash, dizziness, SOB, chest pain, palpitations, abdominal pain or changes in bowel or bladder habits.  He has had frequent urination since starting the Prednisone. No burning or odor associated with this.  No numbness or tingling in his extremities.  No lymphadenopathy found on exam. No liver or spleen tip palpated on exam. No episodes of bleeding or bruising.  He has maintained a good appetite and is staying well hydrated. He states that his weight is down 15 lbs since her started having problems with arthritis in November of last year.  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 He was quite active playing tennis and several other sport up until he developed RA.  He does not smoke or drink alcohol.   ROS: All other 10 point review of systems is negative.   PAST MEDICAL HISTORY:   Past Medical History  Diagnosis Date  . Hypercholesteremia   . Thyroid disease   . Rash   . Hearing difficulty     ALLERGIES: No Known Allergies    MEDICATIONS:  No current outpatient prescriptions on  file prior to visit.   No current facility-administered medications on file prior to visit.     PAST SURGICAL HISTORY Past Surgical History  Procedure Laterality Date  . Thyroid lobectomy  10/21/11  . Thyroid lobectomy  10/21/2011    Procedure: THYROID LOBECTOMY;  Surgeon: Haywood Lasso, MD;  Location: [facility];  Service: General;  Laterality: Left;  Removal left lobe of thyroid    FAMILY HISTORY: Family History  Problem Relation Age of Onset  . Cancer    . Seizures    . Thyroid disease    . Cancer Father     prostate,lung    SOCIAL HISTORY:  reports that he has never smoked. He has never used smokeless tobacco. He reports that he drinks alcohol. He reports that he does not use illicit drugs.  PERFORMANCE STATUS: The patient's performance status is 0 - Asymptomatic  PHYSICAL EXAM: Most Recent Vital Signs: Blood pressure 121/78, pulse 73, temperature 98.2 F (36.8 C), temperature source Oral, resp. rate 14, height 6\' 2"  (1.88 m), weight 196 lb (88.905 kg). BP 121/78 mmHg  Pulse 73  Temp(Src) 98.2 F (36.8 C) (Oral)  Resp 14  Ht 6\' 2"  (1.88 m)  Wt 196 lb (88.905 kg)  BMI 25.15 kg/m2  General Appearance:    Alert, cooperative, no distress, appears stated age  Head:    Normocephalic, without obvious abnormality, atraumatic  Eyes:    PERRL, conjunctiva/corneas  clear, EOM's intact, fundi    benign, both eyes             Throat:   Lips, mucosa, and tongue normal; teeth and gums normal  Neck:   Supple, symmetrical, trachea midline, no adenopathy;       thyroid:  No enlargement/tenderness/nodules; no carotid   bruit or JVD  Back:     Symmetric, no curvature, ROM normal, no CVA tenderness  Lungs:     Clear to auscultation bilaterally, respirations unlabored  Chest wall:    No tenderness or deformity  Heart:    Regular rate and rhythm, S1 and S2 normal, no murmur, rub   or gallop  Abdomen:     Soft, non-tender, bowel sounds active all four quadrants,    no masses, no  organomegaly        Extremities:   Extremities normal, atraumatic, no cyanosis or edema  Pulses:   2+ and symmetric all extremities  Skin:   Skin color, texture, turgor normal, no rashes or lesions  Lymph nodes:   Cervical, supraclavicular, and axillary nodes normal  Neurologic:   CNII-XII intact. Normal strength, sensation and reflexes      throughout    LABORATORY DATA:  No results found for this or any previous visit (from the past 48 hour(s)).    RADIOGRAPHY: No results found.     PATHOLOGY: None   ASSESSMENT/PLAN: Mr. Christian Gentry is a very pleasant 50 yo male with a recent WBC count of 1.8 and Rheumatoid arthritis. Today's blood work shows a WBC count of 3.8. He has had no problem with infection.  His low WBC count could be associated with the RA and will hopefully improve once he starts treatment with Methotrexate. From our standpoint it is ok for the patient to begin this.  We will get an abdominal US next week to evaluate for splenomegaly.  At this point we do not need to schedule a follow-up appointment with him unless an abnormality is identified on his scan. We would certainly be happy to see him for any hem/onc related issues in the future.  All questions were answered. He will contact our office with any questions or concerns.  He was discussed with and also seen by Dr. Marin Olp and he is in agreement with the aforementioned.   [facility]     Addendum:  I saw and examined the patient with Sarah. We looked at his blood under the microscope. I did not see anything that looked suspicious. His white blood cells were minimally decreased. He had good maturation of his white blood cells. I do not see any immature myeloid cells. Had no atypical lymphocytes. There were no nucleated red blood cells. She had no schistocytes or spherocytes. Platelets were adequate in number and size.  Is possible that given his rheumatoid arthritis, that he may have Felty's syndrome which is  splenomegaly which can cause leukopenia. Ultrasound was spleen would be worthwhile.  I don't see that we had to do a bone marrow on him.  Just having the rheumatoid arthritis can cause some transient leukopenia.  I don't see any contraindication to him being on methotrexate. I think the methotrexate will certainly help him out.  For now, I don't think we have to get back to the clinic. He is very nice. He came in with his wife. I do still think we would be helping him out.  We spent about 40 minutes with he and his wife. We answered their  questions.  Lum Keas

## 2016-02-17 ENCOUNTER — Ambulatory Visit (HOSPITAL_BASED_OUTPATIENT_CLINIC_OR_DEPARTMENT_OTHER): Payer: BC Managed Care – PPO

## 2016-07-05 ENCOUNTER — Telehealth: Payer: Self-pay | Admitting: Rheumatology

## 2016-07-05 NOTE — Telephone Encounter (Signed)
Pt. Left message requesting refill of MTX.  [facility] [place] [facility] on [address].

## 2016-07-07 ENCOUNTER — Other Ambulatory Visit: Payer: Self-pay | Admitting: Radiology

## 2016-07-07 MED ORDER — METHOTREXATE 2.5 MG PO TABS: ORAL_TABLET | ORAL | 0 refills | Status: DC

## 2016-07-07 NOTE — Telephone Encounter (Signed)
Called patient to advise his MTX has been sent apologized for the delay, I did not get the message he needed refill until today  06/07/16 last visit  06/10/16 labs WNL

## 2016-07-12 ENCOUNTER — Other Ambulatory Visit: Payer: Self-pay | Admitting: Rheumatology

## 2016-07-29 ENCOUNTER — Other Ambulatory Visit: Payer: Self-pay | Admitting: Rheumatology

## 2016-07-29 LAB — CBC WITH DIFFERENTIAL/PLATELET
BASOS PCT: 1 %
Basophils Absolute: 65 cells/uL (ref 0–200)
Eosinophils Absolute: 65 cells/uL (ref 15–500)
Eosinophils Relative: 1 %
HEMATOCRIT: 40.3 % (ref 38.5–50.0)
HEMOGLOBIN: 13.6 g/dL (ref 13.2–17.1)
LYMPHS ABS: 1560 {cells}/uL (ref 850–3900)
Lymphocytes Relative: 24 %
MCH: 29.6 pg (ref 27.0–33.0)
MCHC: 33.7 g/dL (ref 32.0–36.0)
MCV: 87.6 fL (ref 80.0–100.0)
MONO ABS: 520 {cells}/uL (ref 200–950)
MPV: 10.1 fL (ref 7.5–12.5)
Monocytes Relative: 8 %
NEUTROS PCT: 66 %
Neutro Abs: 4290 cells/uL (ref 1500–7800)
Platelets: 217 10*3/uL (ref 140–400)
RBC: 4.6 MIL/uL (ref 4.20–5.80)
RDW: 15.4 % — ABNORMAL HIGH (ref 11.0–15.0)
WBC: 6.5 10*3/uL (ref 3.8–10.8)

## 2016-07-29 LAB — COMPLETE METABOLIC PANEL WITH GFR
ALT: 8 U/L — ABNORMAL LOW (ref 9–46)
AST: 12 U/L (ref 10–40)
Albumin: 4.5 g/dL (ref 3.6–5.1)
Alkaline Phosphatase: 35 U/L — ABNORMAL LOW (ref 40–115)
BUN: 13 mg/dL (ref 7–25)
CALCIUM: 9.2 mg/dL (ref 8.6–10.3)
CHLORIDE: 105 mmol/L (ref 98–110)
CO2: 28 mmol/L (ref 20–31)
Creat: 0.79 mg/dL (ref 0.60–1.35)
GFR, Est African American: >89 mL/min (ref >=60)
GFR, Est Non African American: >89 mL/min (ref >=60)
Glucose, Bld: 93 mg/dL (ref 65–99)
POTASSIUM: 4 mmol/L (ref 3.5–5.3)
Sodium: 141 mmol/L (ref 135–146)
Total Bilirubin: 0.4 mg/dL (ref 0.2–1.2)
Total Protein: 6.9 g/dL (ref 6.1–8.1)

## 2016-07-30 NOTE — Progress Notes (Signed)
Labs normal.

## 2016-09-03 ENCOUNTER — Ambulatory Visit (INDEPENDENT_AMBULATORY_CARE_PROVIDER_SITE_OTHER): Payer: Self-pay

## 2016-09-03 ENCOUNTER — Encounter: Payer: Self-pay | Admitting: Rheumatology

## 2016-09-03 ENCOUNTER — Ambulatory Visit (INDEPENDENT_AMBULATORY_CARE_PROVIDER_SITE_OTHER): Payer: BC Managed Care – PPO | Admitting: Rheumatology

## 2016-09-03 ENCOUNTER — Ambulatory Visit: Payer: Self-pay | Admitting: Rheumatology

## 2016-09-03 VITALS — BP 132/88 | HR 75 | Resp 14 | Ht 74.0 in | Wt 213.0 lb

## 2016-09-03 DIAGNOSIS — M19041 Primary osteoarthritis, right hand: Secondary | ICD-10-CM | POA: Diagnosis not present

## 2016-09-03 DIAGNOSIS — M2240 Chondromalacia patellae, unspecified knee: Secondary | ICD-10-CM

## 2016-09-03 DIAGNOSIS — M2241 Chondromalacia patellae, right knee: Secondary | ICD-10-CM | POA: Insufficient documentation

## 2016-09-03 DIAGNOSIS — Z862 Personal history of diseases of the blood and blood-forming organs and certain disorders involving the immune mechanism: Secondary | ICD-10-CM

## 2016-09-03 DIAGNOSIS — M19071 Primary osteoarthritis, right ankle and foot: Secondary | ICD-10-CM

## 2016-09-03 DIAGNOSIS — M722 Plantar fascial fibromatosis: Secondary | ICD-10-CM

## 2016-09-03 DIAGNOSIS — M059 Rheumatoid arthritis with rheumatoid factor, unspecified: Secondary | ICD-10-CM | POA: Diagnosis not present

## 2016-09-03 DIAGNOSIS — E785 Hyperlipidemia, unspecified: Secondary | ICD-10-CM | POA: Diagnosis not present

## 2016-09-03 DIAGNOSIS — M24529 Contracture, unspecified elbow: Secondary | ICD-10-CM | POA: Insufficient documentation

## 2016-09-03 DIAGNOSIS — M19042 Primary osteoarthritis, left hand: Secondary | ICD-10-CM | POA: Diagnosis not present

## 2016-09-03 DIAGNOSIS — M2242 Chondromalacia patellae, left knee: Secondary | ICD-10-CM

## 2016-09-03 DIAGNOSIS — G5761 Lesion of plantar nerve, right lower limb: Secondary | ICD-10-CM | POA: Diagnosis not present

## 2016-09-03 DIAGNOSIS — Z79899 Other long term (current) drug therapy: Secondary | ICD-10-CM

## 2016-09-03 DIAGNOSIS — M19072 Primary osteoarthritis, left ankle and foot: Secondary | ICD-10-CM

## 2016-09-03 DIAGNOSIS — H9193 Unspecified hearing loss, bilateral: Secondary | ICD-10-CM

## 2016-09-03 DIAGNOSIS — E049 Nontoxic goiter, unspecified: Secondary | ICD-10-CM

## 2016-09-03 MED ORDER — TRIAMCINOLONE ACETONIDE 40 MG/ML IJ SUSP
20.0000 mg | INTRAMUSCULAR | Status: AC | PRN
  Administered 2016-09-03: 20 mg

## 2016-09-03 MED ORDER — LIDOCAINE HCL 1 % IJ SOLN
0.5000 mL | INTRAMUSCULAR | Status: AC | PRN
  Administered 2016-09-03: .5 mL

## 2016-09-03 NOTE — Progress Notes (Deleted)
   Office Visit Note  Patient: Christian Gentry             Date of Birth: 1965-12-31           MRN: [id number]             PCP: Woody Seller, MD Referring: Christain Sacramento, MD Visit Date: 09/03/2016 Occupation: @GUAROCC @    Subjective:  No chief complaint on file.   History of Present Illness: Christian Cyruss Stauffer is a 50 y.o. male ***   Activities of Daily Living:  Patient reports morning stiffness for *** {minute/hour:19697}.   Patient {ACTIONS;DENIES/REPORTS:21021675::"Denies"} nocturnal pain.  Difficulty dressing/grooming: {ACTIONS;DENIES/REPORTS:21021675::"Denies"} Difficulty climbing stairs: {ACTIONS;DENIES/REPORTS:21021675::"Denies"} Difficulty getting out of chair: {ACTIONS;DENIES/REPORTS:21021675::"Denies"} Difficulty using hands for taps, buttons, cutlery, and/or writing: {ACTIONS;DENIES/REPORTS:21021675::"Denies"}   No Rheumatology ROS completed.   PMFS History:  There are no active problems to display for this patient.   Past Medical History:  Diagnosis Date  . Hearing difficulty   . Hypercholesteremia   . Rash   . Thyroid disease     Family History  Problem Relation Age of Onset  . Cancer Father     prostate,lung  . Cancer    . Seizures    . Thyroid disease     Past Surgical History:  Procedure Laterality Date  . THYROID LOBECTOMY  10/21/11  . THYROID LOBECTOMY  10/21/2011   Procedure: THYROID LOBECTOMY;  Surgeon: Haywood Lasso, MD;  Location: [facility];  Service: General;  Laterality: Left;  Removal left lobe of thyroid   Social History   Social History Narrative  . No narrative on file     Objective: Vital Signs: BP 132/88 (BP Location: Left Arm, Patient Position: Sitting, Cuff Size: Large)   Pulse 75   Resp 14   Ht 6\' 2"  (1.88 m)   Wt 213 lb (96.6 kg)   BMI 27.35 kg/m    Physical Exam   Musculoskeletal Exam: ***  CDAI Exam: No CDAI exam completed.    Investigation: No additional findings.   Imaging: No results  found.  Speciality Comments: No specialty comments available.    Procedures:  No procedures performed Allergies: Patient has no known allergies.   Assessment / Plan:     Visit Diagnoses: No diagnosis found.    Orders: No orders of the defined types were placed in this encounter.  No orders of the defined types were placed in this encounter.   Face-to-face time spent with patient was *** minutes. 50% of time was spent in counseling and coordination of care.  Follow-Up Instructions: No Follow-up on file.   Bo Merino, MD

## 2016-09-03 NOTE — Progress Notes (Signed)
Pharmacy Note Subjective: Patient presents today to the [facility] to see Dr. Estanislado Pandy.   Patient seen by the pharmacist for counseling.    Objective: TB Test: negative (01/20/16) Hepatitis panel: negative (01/20/16) HIV: negative (01/20/16)  CBC    Component Value Date/Time   WBC 6.5 07/29/2016 1134   RBC 4.60 07/29/2016 1134   HGB 13.6 07/29/2016 1134   HGB 12.1 (L) 02/05/2016 1111   HCT 40.3 07/29/2016 1134   HCT 36.6 (L) 02/05/2016 1111   PLT 217 07/29/2016 1134   PLT 227 02/05/2016 1111   MCV 87.6 07/29/2016 1134   MCV 81 (L) 02/05/2016 1111   MCH 29.6 07/29/2016 1134   MCHC 33.7 07/29/2016 1134   RDW 15.4 (H) 07/29/2016 1134   RDW 15.0 02/05/2016 1111   LYMPHSABS 1,560 07/29/2016 1134   LYMPHSABS 0.9 02/05/2016 1111   MONOABS 520 07/29/2016 1134   EOSABS 65 07/29/2016 1134   EOSABS 0.1 02/05/2016 1111   BASOSABS 65 07/29/2016 1134   BASOSABS 0.0 02/05/2016 1111    Assessment/Plan:  Counseled patient that Enbrel is a TNF blocking agent.  Reviewed Enbrel dose of 50 mg once weekly.  Counseled patient on purpose, proper use, and adverse effects of Enbrel.  Reviewed the most common adverse effects including infections, headache, and injection site reactions. Advised patient the initial injection must be given in the office.  Discussed that there is the possibility of an increased risk of malignancy but it is not well understood if this increased risk is due to the medication or the disease state.  Advised patient to get yearly dermatology exams due to risk of skin cancer.  Reviewed the importance of regular labs while on Enbrel therapy.  Counseled patient that Enbrel should be held prior to scheduled surgery.  Counseled patient to avoid live vaccines while on Enbrel.  Advised patient to get annual influenza vaccine and the pneumococcal vaccine as needed.  Patient confirms he got the pneumococcal vaccine in June.  Provided patient with medication education material and  answered questions.  Patient wanted to take home to material on Enbrel and read more before making a decision.    Patient also asked about Morrie Sheldon.  We had previously applied for Morrie Sheldon for the patient which was approved by his insurance company.  Patient reports he never started [place] because he could never find out how much the medication would cost under his insurance.  I advised him that we do have a coupon card that he could use.  I also provided patient with information on Xeljanz.  Counseled patient that Morrie Sheldon is a JAK inhibitor indicated for Rheumatoid Arthritis.  Counseled patient on purpose, proper use, and adverse effects of Xeljanz.  Counseled patient that medication is prescribed to take 5 mg twice a day.  Reviewed the most common adverse effects including infection, diarrhea, headaches.  Also reviewed rare adverse effects such as bowel injury and the need to contact us if she develops stomach pain during treatment.  Patient previously consented to [place] on 02/24/16.    Patient will take home the information on Enbrel and Morrie Sheldon and review thoroughly.  Advised patient to call our clinic when he makes a decision.  Patient is aware that we would need consent on Enbrel prior to initiation if he chooses to proceed with Enbrel.     Elisabeth Most, Pharm.D., BCPS Clinical Pharmacist Pager: [phone] Phone: [phone] 09/03/2016 9:45 AM

## 2016-09-03 NOTE — Progress Notes (Signed)
Office Visit Note  Patient: Christian Gentry             Date of Birth: 16-Nov-1965           MRN: [id number]             PCP: Woody Seller, MD Referring: Christain Sacramento, MD Visit Date: 09/03/2016 Occupation: Biomedical engineer and advising    Subjective:  Hand pain   History of Present Illness: Christian Zyon Rosebush is a 50 y.o. male  with history of sero positive rheumatoid arthritis. He's been having a stiffness in his bilateral second fingers and has incomplete flexion. He continues to have discomfort in his right fourth toe. He denies any joint swelling. He continues to have bilateral elbow joint contractures with some stiffness. He has not noticed any difference on Plaquenil which was added last visit in September 2017.  Activities of Daily Living:  Patient reports morning stiffness for 4 hours.   Patient Denies nocturnal pain.  Difficulty dressing/grooming: Denies Difficulty climbing stairs: Denies Difficulty getting out of chair: Denies Difficulty using hands for taps, buttons, cutlery, and/or writing: Denies   Review of Systems  Constitutional: Negative for night sweats and weakness ( ).  HENT: Negative for mouth sores, mouth dryness and nose dryness.   Eyes: Positive for dryness. Negative for redness.  Respiratory: Negative for shortness of breath and difficulty breathing.   Cardiovascular: Negative for chest pain, palpitations, hypertension, irregular heartbeat and swelling in legs/feet.  Gastrointestinal: Negative for constipation and diarrhea.  Endocrine: Negative for increased urination.  Musculoskeletal: Positive for arthralgias, joint pain and morning stiffness. Negative for joint swelling, myalgias, muscle weakness, muscle tenderness and myalgias.  Skin: Negative for color change, rash, hair loss, nodules/bumps, skin tightness, ulcers and sensitivity to sunlight.  Allergic/Immunologic: Negative for susceptible to infections.  Neurological: Negative for  dizziness, fainting, memory loss and night sweats.  Hematological: Negative for swollen glands.  Psychiatric/Behavioral: Negative for depressed mood and sleep disturbance. The patient is not nervous/anxious.     PMFS History:  Patient Active Problem List   Diagnosis Date Noted  . Seropositive rheumatoid arthritis ([place]) 09/03/2016  . High risk medication use 09/03/2016  . Primary osteoarthritis of both hands 09/03/2016  . Primary osteoarthritis of both feet 09/03/2016  . Flexion contracture of elbow 09/03/2016  . Chondromalacia of patella 09/03/2016  . Plantar fasciitis 09/03/2016  . Dyslipidemia 09/03/2016  . Goiter 09/03/2016  . Bilateral hearing loss 09/03/2016  . History of neutropenia 09/03/2016    Past Medical History:  Diagnosis Date  . Hearing difficulty   . Hypercholesteremia   . Rash   . Thyroid disease     Family History  Problem Relation Age of Onset  . Cancer Father     prostate,lung  . Cancer    . Seizures    . Thyroid disease     Past Surgical History:  Procedure Laterality Date  . THYROID LOBECTOMY  10/21/11  . THYROID LOBECTOMY  10/21/2011   Procedure: THYROID LOBECTOMY;  Surgeon: Haywood Lasso, MD;  Location: [facility];  Service: General;  Laterality: Left;  Removal left lobe of thyroid   Social History   Social History Narrative  . No narrative on file     Objective: Vital Signs: BP 132/88 (BP Location: Left Arm, Patient Position: Sitting, Cuff Size: Large)   Pulse 75   Resp 14   Ht 6\' 2"  (1.88 m)   Wt 213 lb (96.6 kg)   BMI 27.35 kg/m  Physical Exam  Constitutional: He is oriented to person, place, and time. He appears well-developed and well-nourished.  HENT:  Head: Normocephalic and atraumatic.  Eyes: Conjunctivae and EOM are normal. Pupils are equal, round, and reactive to light.  Neck: Normal range of motion. Neck supple.  Cardiovascular: Normal rate, regular rhythm and normal heart sounds.   Pulmonary/Chest: Effort normal and  breath sounds normal.  Abdominal: Soft. Bowel sounds are normal.  Neurological: He is alert and oriented to person, place, and time.  Skin: Skin is warm and dry. Capillary refill takes less than 2 seconds.  Psychiatric: He has a normal mood and affect. His behavior is normal.  Nursing note and vitals reviewed.    Musculoskeletal Exam: C-spine and thoracic lumbar spine good range of motion. Shoulder joints good range of motion he has tended contracture in his right elbow and 15 contracture in his left elbow with no synovitis or tenderness. He has some limitation of range of motion of her wrists right wrist joint. There is thickening of bilateral second and third MCP joint and bilateral second PIP joint with some synovitis. He has incomplete flexion of his bilateral second MCP and PIP joints. Hip joints knee joints ankles were good range of motion he had tenderness on palpation over his right fourth MTP joint and discomfort on walking.  CDAI Exam: CDAI Homunculus Exam:   Tenderness:  RUE: ulnohumeral and radiohumeral LUE: ulnohumeral and radiohumeral Right hand: 2nd MCP and 2nd PIP Left hand: 2nd MCP and 2nd PIP Right foot: 4th MTP  Swelling:  Right hand: 2nd PIP Left hand: 2nd PIP  Joint Counts:  CDAI Tender Joint count: 6 CDAI Swollen Joint count: 2  Global Assessments:  Patient Global Assessment: 4 Provider Global Assessment: 4  CDAI Calculated Score: 16    Investigation: Findings:    April 2017:  Rheumatoid factor was 40.  CCP was more than 200.  Sed rate 38.  C-reactive protein was 53.6.    01/20/2016 X-ray of bilateral elbow joints, 2 views, were within normal limits without any erosive changes.  Bilateral hand x-rays, 2 views, showed bilateral PIP narrowing without any MCP narrowing or erosive changes.  No intercarpal joint space narrowing was noted.  Bilateral knee joint x-rays, 2 views, were within normal limits with minimal patellofemoral narrowing.  Bilateral knee  joint x-rays, 2 views, showed minimal PIP/DIP narrowing.  No MTP changes, no erosive changes.    May 2017:  White cell count was 1.8.  Comprehensive metabolic panel, CK, and UA were normal.  ANA was negative.  Hepatitis panel, HIV, immunoglobulins, SPEP, and TB Gold were negative.  The labs from Dr. Antonieta Pert office a few weeks later showed a WBC count of 3.8.    His labs from  02/19/2016  showed a white cell count of 2.6, hemoglobin 12.0, and comprehensive metabolic panel was normal.   02/20/2016 His Rapid-3 score today is 6.7 which comes under high severity. 07/29/2016 CBC normal, CMP normal     Imaging: No results found.  Speciality Comments: No specialty comments available.    Procedures:  Foot Inj Date/Time: 09/03/2016 10:04 AM Performed by: Bo Merino Authorized by: Bo Merino   Consent Given by:  Patient Site marked: the procedure site was marked   Timeout: prior to procedure the correct patient, procedure, and site was verified   Indications:  Neuroma Condition: Morton's Neuroma   Location:  R foot Prep: patient was prepped and draped in usual sterile fashion   Needle Size:  27 G Approach:  Dorsal Medications:  0.5 mL lidocaine 1 %; 20 mg triamcinolone acetonide 40 MG/ML Patient Tolerance:  Patient tolerated the procedure well with no immediate complications   Allergies: Patient has no known allergies.   Assessment / Plan:     Visit Diagnoses: Seropositive rheumatoid arthritis ([place]) - Positive rheumatoid factor, positive CCP -patient gives history of the stiffness in his hands bilaterally lasting for several hours in the morning he is still unable to completely flex his bilateral second MCP and PIP joints of her's hands. Also having a lot of discomfort in his right foot. Plan: XR Hand 2 View Right, XR Hand 2 View Left, XR Foot 2 Views Right, XR Foot 2 Views Left. X-rays today revealed only mild osteoarthritic changes in his bilateral hands and feet. No  erosive changes were noted. We had detailed discussion with patient regarding more aggressive therapy. We discussed the options and Enbrel at length at this point he would like to review these medications before making further decision. I will also schedule ultrasound of bilateral hands to evaluate for any underlying synovitis which we scheduled.  High risk medication use - Methotrexate 10 tablets by mouth every week, folic acid 2 mg by mouth daily. His labs are stable now we will check labs every 3 months.  Primary osteoarthritis of both hands: Continues to have some stiffness  Primary osteoarthritis of both feet: Continues to have some stiffness  Flexion contracture of elbow, bilateral: The contracture some old unstable  Chondromalacia of patella, bilateral: He has some crepitus in his bilateral knee joints  Morton's neuroma right foot: Different treatment options and side effects were discussed per his request after informed consent was obtained the area was prepped in sterile fashion and injected with cortisone as described above he tolerated the procedure well we will see response to that.  He has multiple other medical problems for which she's seeing other physicians:  Dyslipidemia  Goiter - Partial thyroidectomy  Bilateral hearing loss  History of neutropenia - Evaluated by Dr. Marin Olp in the past. It responded to RA treatment.  Morton's neuroma of right foot    Orders: Orders Placed This Encounter  Procedures  . Foot Injection  . XR Hand 2 View Right  . XR Hand 2 View Left  . XR Foot 2 Views Right  . XR Foot 2 Views Left   No orders of the defined types were placed in this encounter.   Face-to-face time spent with patient was 45 minutes. 50% of time was spent in counseling and coordination of care.  Follow-Up Instructions: Return in about 3 months (around 12/02/2016) for Rheumatoid arthritis.   Bo Merino, MD

## 2016-09-03 NOTE — Patient Instructions (Addendum)
Standing Labs We placed an order today for your standing lab work.    Please come back and get your standing labs in February 2018 and every 3 month  We have open lab Monday through Friday from 8:30-11:30 AM and 1:30-4 PM at the office of Dr. Tresa Moore, PA.   The office is located at [address] [place], [place], [place] [address] No appointment is necessary.   Labs are drawn by [facility].  You may receive a bill from [facility] for your lab work.     Etanercept injection What is this medicine? ETANERCEPT (et a [employer]) is used for the treatment of rheumatoid arthritis in adults and children. The medicine is also used to treat psoriatic arthritis, ankylosing spondylitis, and psoriasis. This medicine may be used for other purposes; ask your health care provider or pharmacist if you have questions. COMMON BRAND NAME(S): Enbrel What should I tell my health care provider before I take this medicine? They need to know if you have any of these conditions: -blood disorders -cancer -congestive heart failure -diabetes -exposure to chickenpox -immune system problems -infection -multiple sclerosis -seizure disorder -tuberculosis, a positive skin test for tuberculosis or have recently been in close contact with someone who has tuberculosis -Wegener's granulomatosis -an unusual or allergic reaction to etanercept, latex, other medicines, foods, dyes, or preservatives -pregnant or trying to get pregnant -breast-feeding How should I use this medicine? The medicine is given by injection under the skin. You will be taught how to prepare and give this medicine. Use exactly as directed. Take your medicine at regular intervals. Do not take your medicine more often than directed. It is important that you put your used needles and syringes in a special sharps container. Do not put them in a trash can. If you do not have a sharps container, call your pharmacist or healthcare provider  to get one. A special MedGuide will be given to you by the pharmacist with each prescription and refill. Be sure to read this information carefully each time. Talk to your pediatrician regarding the use of this medicine in children. While this drug may be prescribed for children as young as 22 years of age for selected conditions, precautions do apply. Overdosage: If you think you have taken too much of this medicine contact a poison control center or emergency room at once. NOTE: This medicine is only for you. Do not share this medicine with others. What if I miss a dose? If you miss a dose, contact your health care professional to find out when you should take your next dose. Do not take double or extra doses without advice. What may interact with this medicine? Do not take this medicine with any of the following medications: -anakinra This medicine may also interact with the following medications: -cyclophosphamide -sulfasalazine -vaccines This list may not describe all possible interactions. Give your health care provider a list of all the medicines, herbs, non-prescription drugs, or dietary supplements you use. Also tell them if you smoke, drink alcohol, or use illegal drugs. Some items may interact with your medicine. What should I watch for while using this medicine? Tell your doctor or healthcare professional if your symptoms do not start to get better or if they get worse. You will be tested for tuberculosis (TB) before you start this medicine. If your doctor prescribes any medicine for TB, you should start taking the TB medicine before starting this medicine. Make sure to finish the full course of TB medicine.  Call your doctor or health care professional for advice if you get a fever, chills or sore throat, or other symptoms of a cold or flu. Do not treat yourself. This drug decreases your body's ability to fight infections. Try to avoid being around people who are sick. What side effects  may I notice from receiving this medicine? Side effects that you should report to your doctor or health care professional as soon as possible: -allergic reactions like skin rash, itching or hives, swelling of the face, lips, or tongue -changes in vision -fever, chills or any other sign of infection -numbness or tingling in legs or other parts of the body -red, scaly patches or raised bumps on the skin -shortness of breath or difficulty breathing -swollen lymph nodes in the neck, underarm, or groin areas -unexplained weight loss -unusual bleeding or bruising -unusual swelling or fluid retention in the legs -unusually weak or tired Side effects that usually do not require medical attention (report to your doctor or health care professional if they continue or are bothersome): -dizziness -headache -nausea -redness, itching, or swelling at the injection site -vomiting This list may not describe all possible side effects. Call your doctor for medical advice about side effects. You may report side effects to FDA at 1-800-FDA-1088. Where should I keep my medicine? Keep out of the reach of children. Store between 2 and 8 degrees C (36 and 46 degrees F). Do not freeze or shake. Protect from light. Throw away any unused medicine after the expiration date. You will be instructed on how to store this medicine. NOTE: This sheet is a summary. It may not cover all possible information. If you have questions about this medicine, talk to your doctor, pharmacist, or health care provider.  2017 Elsevier/Gold Standard (2012-03-06 15:33:36)  Tofacitinib tablets What is this medicine? TOFACITINIB (TOE fa SYE ti nib) is a medicine that works on the immune system. This medicine is used to treat rheumatoid arthritis. COMMON BRAND NAME(S): Morrie Sheldon What should I tell my health care provider before I take this medicine? They need to know if you have any of these conditions: -cancer -diabetes -high  cholesterol -immune system problems -infection (especially a virus infection such as chickenpox, cold sores, or herpes) -kidney disease -liver disease -low blood counts, like low white cell, platelet, or red cell counts -stomach or intestine problems -an unusual or allergic reaction to tofacitinib, other medicines, foods, dyes, or preservatives -pregnant or trying to get pregnant -breast-feeding How should I use this medicine? Take this medicine by mouth with a glass of water. Follow the directions on the prescription label. You can take it with or without food. If it upsets your stomach, take it with food. A special MedGuide will be given to you by the pharmacist with each prescription and refill. Be sure to read this information carefully each time. Talk to your pediatrician regarding the use of this medicine in children. Special care may be needed. What if I miss a dose? If you miss a dose, take it as soon as you can. If it is almost time for your next dose, take only that dose. Do not take double or extra doses. What may interact with this medicine? Do not take this medicine with any of the following medications: -abatacept -adalimumab -anakinra -azathioprine -certolizumab -cyclosporine -etanercept -golimumab -infliximab -live vaccines -rituximab -tacrolimus -tocilizumab This medicine may also interact with the following medications: -fluconazole -ketoconazole -rifampin -vaccines What should I watch for while using this medicine? Tell your doctor  or healthcare professional if your symptoms do not start to get better or if they get worse. Avoid taking products that contain aspirin, acetaminophen, ibuprofen, naproxen, or ketoprofen unless instructed by your doctor. These medicines may hide a fever. Call your doctor or health care professional for advice if you get a fever, chills or sore throat, or other symptoms of a cold or flu. Do not treat yourself. This drug decreases  your body's ability to fight infections. Try to avoid being around people who are sick. What side effects may I notice from receiving this medicine? Side effects that you should report to your doctor or health care professional as soon as possible: -allergic reactions like skin rash, itching or hives, swelling of the face, lips, or tongue -breathing problems -dizziness -signs of infection - fever or chills, cough, sore throat, pain or trouble passing urine -signs and symptoms of liver injury like dark yellow or brown urine; general ill feeling or flu-like symptoms; light-colored stools; loss of appetite; nausea; right upper belly pain; unusually weak or tired; yellowing of the eyes or skin -stomach pain or a sudden change in bowel habits -unusually weak or tired Side effects that usually do not require medical attention (report to your doctor or health care professional if they continue or are bothersome): -diarrhea -headache -muscle aches -runny nose -sinus trouble Where should I keep my medicine? Keep out of the reach of children. Store between 20 and 25 degrees C (68 and 77 degrees F). Throw away any unused medicine after the expiration date.  2017 Elsevier/Gold Standard (2015-10-20 15:19:57)

## 2016-09-21 ENCOUNTER — Other Ambulatory Visit: Payer: Self-pay | Admitting: Rheumatology

## 2016-09-21 MED ORDER — METHOTREXATE 2.5 MG PO TABS: ORAL_TABLET | ORAL | 0 refills | Status: DC

## 2016-09-21 NOTE — Telephone Encounter (Signed)
ok 

## 2016-09-21 NOTE — Telephone Encounter (Signed)
Last Visit: 09/03/16 Next visit: 12/02/16 Labs: 07/29/16 WNL  Okay to refill MTX?

## 2016-09-21 NOTE — Telephone Encounter (Signed)
Patient is requesting refill of MTX to be sent to [facility] in [facility].

## 2016-10-29 ENCOUNTER — Other Ambulatory Visit: Payer: Self-pay | Admitting: *Deleted

## 2016-10-29 DIAGNOSIS — Z79899 Other long term (current) drug therapy: Secondary | ICD-10-CM

## 2016-10-29 LAB — COMPLETE METABOLIC PANEL WITH GFR
ALBUMIN: 4.4 g/dL (ref 3.6–5.1)
ALK PHOS: 38 U/L — AB (ref 40–115)
ALT: 11 U/L (ref 9–46)
AST: 14 U/L (ref 10–40)
BILIRUBIN TOTAL: 0.4 mg/dL (ref 0.2–1.2)
BUN: 14 mg/dL (ref 7–25)
CALCIUM: 9.4 mg/dL (ref 8.6–10.3)
CO2: 27 mmol/L (ref 20–31)
Chloride: 108 mmol/L (ref 98–110)
Creat: 0.82 mg/dL (ref 0.60–1.35)
GFR, Est African American: >89 mL/min (ref >=60)
GFR, Est Non African American: >89 mL/min (ref >=60)
Glucose, Bld: 85 mg/dL (ref 65–99)
POTASSIUM: 4.1 mmol/L (ref 3.5–5.3)
Sodium: 140 mmol/L (ref 135–146)
TOTAL PROTEIN: 6.7 g/dL (ref 6.1–8.1)

## 2016-10-29 LAB — CBC WITH DIFFERENTIAL/PLATELET
BASOS ABS: 56 {cells}/uL (ref 0–200)
Basophils Relative: 1 %
Eosinophils Absolute: 112 cells/uL (ref 15–500)
Eosinophils Relative: 2 %
HEMATOCRIT: 41.1 % (ref 38.5–50.0)
HEMOGLOBIN: 14 g/dL (ref 13.2–17.1)
LYMPHS ABS: 1456 {cells}/uL (ref 850–3900)
Lymphocytes Relative: 26 %
MCH: 29.5 pg (ref 27.0–33.0)
MCHC: 34.1 g/dL (ref 32.0–36.0)
MCV: 86.7 fL (ref 80.0–100.0)
MONO ABS: 560 {cells}/uL (ref 200–950)
MPV: 10 fL (ref 7.5–12.5)
Monocytes Relative: 10 %
NEUTROS PCT: 61 %
Neutro Abs: 3416 cells/uL (ref 1500–7800)
Platelets: 204 10*3/uL (ref 140–400)
RBC: 4.74 MIL/uL (ref 4.20–5.80)
RDW: 15.2 % — ABNORMAL HIGH (ref 11.0–15.0)
WBC: 5.6 10*3/uL (ref 3.8–10.8)

## 2016-11-17 ENCOUNTER — Ambulatory Visit: Payer: BC Managed Care – PPO | Admitting: Rheumatology

## 2016-12-02 ENCOUNTER — Ambulatory Visit: Payer: BC Managed Care – PPO | Admitting: Rheumatology

## 2016-12-09 NOTE — Progress Notes (Signed)
09/03/2016 Assessment / Plan:     Visit Diagnoses: Seropositive rheumatoid arthritis ([place]) - Positive rheumatoid factor, positive CCP -patient gives history of the stiffness in his hands bilaterally lasting for several hours in the morning he is still unable to completely flex his bilateral second MCP and PIP joints of her's hands. Also having a lot of discomfort in his right foot. Plan: XR Hand 2 View Right, XR Hand 2 View Left, XR Foot 2 Views Right, XR Foot 2 Views Left. X-rays today revealed only mild osteoarthritic changes in his bilateral hands and feet. No erosive changes were noted. We had detailed discussion with patient regarding more aggressive therapy. We discussed the options and Enbrel at length at this point he would like to review these medications before making further decision. I will also schedule ultrasound of bilateral hands to evaluate for any underlying synovitis which we scheduled.  High risk medication use - Methotrexate 10 tablets by mouth every week, folic acid 2 mg by mouth daily. His labs are stable now we will check labs every 3 months. Ultrasound examination today showed only mild synovitis in bilateral third MCP joints and bilateral wrist joints. No erosive changes were noted and no tenosynovitis was noted. Patient is quite pleased on current combination of methotrexate and Plaquenil. He does not want to change his treatment at this point. Natural anti-inflammatories were discussed at length. He'll be returning for follow-up visit in a month. Bo Merino, MD

## 2016-12-15 ENCOUNTER — Inpatient Hospital Stay (INDEPENDENT_AMBULATORY_CARE_PROVIDER_SITE_OTHER): Payer: BC Managed Care – PPO

## 2016-12-15 ENCOUNTER — Encounter: Payer: Self-pay | Admitting: Rheumatology

## 2016-12-15 ENCOUNTER — Ambulatory Visit (INDEPENDENT_AMBULATORY_CARE_PROVIDER_SITE_OTHER): Payer: BC Managed Care – PPO | Admitting: Rheumatology

## 2016-12-15 DIAGNOSIS — M79642 Pain in left hand: Secondary | ICD-10-CM

## 2016-12-15 DIAGNOSIS — M79641 Pain in right hand: Secondary | ICD-10-CM

## 2016-12-22 ENCOUNTER — Ambulatory Visit: Payer: BC Managed Care – PPO | Admitting: Rheumatology

## 2017-01-27 NOTE — Progress Notes (Deleted)
Office Visit Note  Patient: Christian Gentry             Date of Birth: 1966-01-16           MRN: [id number]             PCP: Christain Sacramento, MD Referring: Christain Sacramento, MD Visit Date: 02/04/2017 Occupation: @GUAROCC @    Subjective:  No chief complaint on file.   History of Present Illness: Christian Gentry is a 51 y.o. male ***   Activities of Daily Living:  Patient reports morning stiffness for *** {minute/hour:19697}.   Patient {ACTIONS;DENIES/REPORTS:21021675::"Denies"} nocturnal pain.  Difficulty dressing/grooming: {ACTIONS;DENIES/REPORTS:21021675::"Denies"} Difficulty climbing stairs: {ACTIONS;DENIES/REPORTS:21021675::"Denies"} Difficulty getting out of chair: {ACTIONS;DENIES/REPORTS:21021675::"Denies"} Difficulty using hands for taps, buttons, cutlery, and/or writing: {ACTIONS;DENIES/REPORTS:21021675::"Denies"}   No Rheumatology ROS completed.   PMFS History:  Patient Active Problem List   Diagnosis Date Noted  . Seropositive rheumatoid arthritis ([place]) 09/03/2016  . High risk medication use 09/03/2016  . Primary osteoarthritis of both hands 09/03/2016  . Primary osteoarthritis of both feet 09/03/2016  . Flexion contracture of elbow 09/03/2016  . Chondromalacia of patella 09/03/2016  . Plantar fasciitis 09/03/2016  . Dyslipidemia 09/03/2016  . Goiter 09/03/2016  . Bilateral hearing loss 09/03/2016  . History of neutropenia 09/03/2016    Past Medical History:  Diagnosis Date  . Hearing difficulty   . Hypercholesteremia   . Rash   . Thyroid disease     Family History  Problem Relation Age of Onset  . Cancer Father        prostate,lung  . Cancer Unknown   . Seizures Unknown   . Thyroid disease Unknown    Past Surgical History:  Procedure Laterality Date  . THYROID LOBECTOMY  10/21/11  . THYROID LOBECTOMY  10/21/2011   Procedure: THYROID LOBECTOMY;  Surgeon: Haywood Lasso, MD;  Location: [facility];  Service: General;  Laterality: Left;  Removal  left lobe of thyroid   Social History   Social History Narrative  . No narrative on file     Objective: Vital Signs: There were no vitals taken for this visit.   Physical Exam   Musculoskeletal Exam: ***  CDAI Exam: No CDAI exam completed.    Investigation: Findings:  TB Test: negative (01/20/16) Hepatitis panel: negative (01/20/16) HIV: negative (01/20/16)  CBC Latest Ref Rng & Units 10/29/2016 07/29/2016 02/05/2016  WBC 3.8 - 10.8 K/uL 5.6 6.5 3.8(L)  Hemoglobin 13.2 - 17.1 g/dL 14.0 13.6 12.1(L)  Hematocrit 38.5 - 50.0 % 41.1 40.3 36.6(L)  Platelets 140 - 400 K/uL 204 217 227    CMP Latest Ref Rng & Units 10/29/2016 07/29/2016  Glucose 65 - 99 mg/dL 85 93  BUN 7 - 25 mg/dL 14 13  Creatinine 0.60 - 1.35 mg/dL 0.82 0.79  Sodium 135 - 146 mmol/L 140 141  Potassium 3.5 - 5.3 mmol/L 4.1 4.0  Chloride 98 - 110 mmol/L 108 105  CO2 20 - 31 mmol/L 27 28  Calcium 8.6 - 10.3 mg/dL 9.4 9.2  Total Protein 6.1 - 8.1 g/dL 6.7 6.9  Total Bilirubin 0.2 - 1.2 mg/dL 0.4 0.4  Alkaline Phos 40 - 115 U/L 38(L) 35(L)  AST 10 - 40 U/L 14 12  ALT 9 - 46 U/L 11 8(L)    Imaging: No results found.  Speciality Comments: No specialty comments available.    Procedures:  No procedures performed Allergies: Patient has no known allergies.   Assessment / Plan:  Visit Diagnoses: Seropositive rheumatoid arthritis ([place])  Primary osteoarthritis of both feet  Primary osteoarthritis of both hands  Plantar fasciitis  History of neutropenia  High risk medication use  Goiter  Flexion contracture of elbow, unspecified laterality  Chondromalacia of patella, unspecified laterality  History of hyperlipidemia    Orders: No orders of the defined types were placed in this encounter.  No orders of the defined types were placed in this encounter.   Face-to-face time spent with patient was *** minutes. 50% of time was spent in counseling and coordination of care.  Follow-Up  Instructions: No Follow-up on file.   Amy Littrell, RT  Note - This record has been created using [employer].  Chart creation errors have been sought, but may not always  have been located. Such creation errors do not reflect on  the standard of medical care.

## 2017-02-04 ENCOUNTER — Other Ambulatory Visit: Payer: Self-pay | Admitting: Rheumatology

## 2017-02-04 ENCOUNTER — Ambulatory Visit: Payer: BC Managed Care – PPO | Admitting: Rheumatology

## 2017-02-04 ENCOUNTER — Other Ambulatory Visit: Payer: Self-pay | Admitting: *Deleted

## 2017-02-04 DIAGNOSIS — Z79899 Other long term (current) drug therapy: Secondary | ICD-10-CM

## 2017-02-04 LAB — CBC WITH DIFFERENTIAL/PLATELET
Basophils Absolute: 59 cells/uL (ref 0–200)
Basophils Relative: 1 %
EOS PCT: 2 %
Eosinophils Absolute: 118 cells/uL (ref 15–500)
HCT: 41.9 % (ref 38.5–50.0)
HEMOGLOBIN: 14 g/dL (ref 13.2–17.1)
LYMPHS ABS: 1593 {cells}/uL (ref 850–3900)
Lymphocytes Relative: 27 %
MCH: 29.4 pg (ref 27.0–33.0)
MCHC: 33.4 g/dL (ref 32.0–36.0)
MCV: 87.8 fL (ref 80.0–100.0)
MONOS PCT: 9 %
MPV: 9.9 fL (ref 7.5–12.5)
Monocytes Absolute: 531 cells/uL (ref 200–950)
NEUTROS ABS: 3599 {cells}/uL (ref 1500–7800)
Neutrophils Relative %: 61 %
PLATELETS: 216 10*3/uL (ref 140–400)
RBC: 4.77 MIL/uL (ref 4.20–5.80)
RDW: 14.8 % (ref 11.0–15.0)
WBC: 5.9 10*3/uL (ref 3.8–10.8)

## 2017-02-05 ENCOUNTER — Other Ambulatory Visit: Payer: Self-pay | Admitting: Rheumatology

## 2017-02-05 LAB — COMPLETE METABOLIC PANEL WITH GFR
ALT: 9 U/L (ref 9–46)
AST: 12 U/L (ref 10–40)
Albumin: 4.1 g/dL (ref 3.6–5.1)
Alkaline Phosphatase: 35 U/L — ABNORMAL LOW (ref 40–115)
BILIRUBIN TOTAL: 0.5 mg/dL (ref 0.2–1.2)
BUN: 14 mg/dL (ref 7–25)
CO2: 24 mmol/L (ref 20–31)
Calcium: 9 mg/dL (ref 8.6–10.3)
Chloride: 108 mmol/L (ref 98–110)
Creat: 0.76 mg/dL (ref 0.60–1.35)
GFR, Est African American: >89 mL/min (ref >=60)
Glucose, Bld: 87 mg/dL (ref 65–99)
POTASSIUM: 4 mmol/L (ref 3.5–5.3)
SODIUM: 141 mmol/L (ref 135–146)
TOTAL PROTEIN: 6.4 g/dL (ref 6.1–8.1)

## 2017-02-06 NOTE — Progress Notes (Signed)
Add phosphorus level

## 2017-02-08 NOTE — Telephone Encounter (Signed)
Last Visit: 12/15/16 Next Visit: 03/11/17  Okay to refill Folic Acid?

## 2017-02-08 NOTE — Telephone Encounter (Signed)
ok 

## 2017-02-10 ENCOUNTER — Other Ambulatory Visit: Payer: Self-pay | Admitting: Rheumatology

## 2017-02-10 LAB — PHOSPHORUS: Phosphorus: 2.9 mg/dL (ref 2.5–4.5)

## 2017-02-10 NOTE — Telephone Encounter (Signed)
Check Phosphorus level with next lab.Ok to refill

## 2017-02-10 NOTE — Telephone Encounter (Signed)
Phosphorus Level added to current labs

## 2017-02-10 NOTE — Telephone Encounter (Signed)
Phosphorus level added to current labs.

## 2017-02-10 NOTE — Telephone Encounter (Signed)
Ok to refill, Phosphorus level with next lab

## 2017-02-10 NOTE — Telephone Encounter (Signed)
Last Visit: 12/15/16 Next Visit: 03/11/17 Labs: 02/04/17 Alkaline Phos. Decreased at 35  PLQ eye exam: 05/2016 WNL  Okay to refill PLQ?

## 2017-02-10 NOTE — Telephone Encounter (Signed)
Last Visit: 12/15/16 Next Visit: 03/11/17 Labs: 02/04/17 Alkaline Phos. Decreased at 35   Okay to refill MTX?

## 2017-03-11 ENCOUNTER — Ambulatory Visit: Payer: BC Managed Care – PPO | Admitting: Rheumatology

## 2017-04-11 DIAGNOSIS — M24522 Contracture, left elbow: Secondary | ICD-10-CM | POA: Insufficient documentation

## 2017-04-11 NOTE — Progress Notes (Signed)
Office Visit Note  Patient: Christian Gentry             Date of Birth: [birth year]           MRN: [id number]             PCP: Christain Sacramento, MD Referring: Christain Sacramento, MD Visit Date: 04/14/2017 Occupation: @GUAROCC @    Subjective:  Follow-up (RA follow up. No change since last visit. Left hand stiffness in morning and get better throughout day.)   History of Present Illness: Christian Windel Keziah is a 51 y.o. male with history of sero positive rheumatoid arthritis and osteoarthritis. He states he continues to have some stiffness and discomfort in his hands. He has more stiffness in his left hand compared to his right hand especially over his MCPs. His ultrasound done in April 2018 revealed mild synovitis. At this time he chose not to go on Enbrel and continue on current therapy. No other joints are painful or swollen.  Activities of Daily Living:  Patient reports morning stiffness for 30 minutes.   Patient Denies nocturnal pain.  Difficulty dressing/grooming: Denies Difficulty climbing stairs: Denies Difficulty getting out of chair: Denies Difficulty using hands for taps, buttons, cutlery, and/or writing: Denies   Review of Systems  Constitutional: Positive for fatigue. Negative for night sweats and weakness ( ).  HENT: Negative for mouth sores, mouth dryness and nose dryness.   Eyes: Positive for dryness. Negative for redness.  Respiratory: Negative for shortness of breath and difficulty breathing.   Cardiovascular: Negative for chest pain, palpitations, hypertension, irregular heartbeat and swelling in legs/feet.  Gastrointestinal: Negative for constipation and diarrhea.  Endocrine: Negative for increased urination.  Musculoskeletal: Positive for arthralgias, joint pain and morning stiffness. Negative for joint swelling, myalgias, muscle weakness, muscle tenderness and myalgias.  Skin: Negative for color change, rash, hair loss, nodules/bumps, skin tightness, ulcers and  sensitivity to sunlight.  Allergic/Immunologic: Negative for susceptible to infections.  Neurological: Negative for dizziness, fainting, memory loss and night sweats.  Hematological: Negative for swollen glands.  Psychiatric/Behavioral: Positive for sleep disturbance. Negative for depressed mood. The patient is not nervous/anxious.        Just became grandfather    PMFS History:  Patient Active Problem List   Diagnosis Date Noted  . 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 Seropositive rheumatoid arthritis ([place]) 09/03/2016  . High risk medication use 09/03/2016  . Primary osteoarthritis of both hands 09/03/2016  . Primary osteoarthritis of both feet 09/03/2016  . Flexion contracture of elbow 09/03/2016  . 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 Plantar fasciitis 09/03/2016  . Dyslipidemia 09/03/2016  . Goiter 09/03/2016  . Bilateral hearing loss 09/03/2016  . History of neutropenia 09/03/2016    Past Medical History:  Diagnosis Date  . Hearing difficulty   . Hypercholesteremia   . Rash   . Thyroid disease     Family History  Problem Relation Age of Onset  . Cancer Father        prostate,lung  . Cancer Unknown   . Seizures Unknown   . Thyroid disease Unknown    Past Surgical History:  Procedure Laterality Date  . THYROID LOBECTOMY  10/21/11  . THYROID LOBECTOMY  10/21/2011   Procedure: THYROID LOBECTOMY;  Surgeon: Haywood Lasso, MD;  Location: [facility];  Service: General;  Laterality: Left;  Removal left lobe of thyroid   Social History   Social History Narrative  . No narrative on file  Objective: Vital Signs: BP 115/76 (BP Location: Left Arm, Patient Position: Sitting, Cuff Size: Normal)   Pulse (!) 59   Ht 6\' 2"  (1.88 m)   Wt 211 lb (95.7 kg)   BMI 27.09 kg/m    Physical Exam  Constitutional: He is oriented to person, place, and time. He appears well-developed and well-nourished.  HENT:  Head: Normocephalic and atraumatic.  Eyes: Pupils  are equal, round, and reactive to light. Conjunctivae and EOM are normal.  Neck: Normal range of motion. Neck supple.  Cardiovascular: Normal rate, regular rhythm and normal heart sounds.   Pulmonary/Chest: Effort normal and breath sounds normal.  Abdominal: Soft. Bowel sounds are normal.  Neurological: He is alert and oriented to person, place, and time.  Skin: Skin is warm and dry. Capillary refill takes less than 2 seconds.  Mild periungal dark pigmentation was noted  Psychiatric: He has a normal mood and affect. His behavior is normal.  Nursing note and vitals reviewed.    Musculoskeletal Exam: C-spine and thoracic lumbar spine good range of motion. Shoulder joints elbow joints wrist joint MCPs PIPs DIPs with good range of motion with no synovitis. He had no tenderness on palpation. Hip joints knee joints ankles MTPs PIPs DIPs with good range of motion with no synovitis.  CDAI Exam: CDAI Homunculus Exam:   Joint Counts:  CDAI Tender Joint count: 0 CDAI Swollen Joint count: 0  Global Assessments:  Patient Global Assessment: 2 Provider Global Assessment: 2  CDAI Calculated Score: 4    Investigation: Findings:  01/20/2016 negative TB gold   CBC Latest Ref Rng & Units 02/04/2017 10/29/2016 07/29/2016  WBC 3.8 - 10.8 K/uL 5.9 5.6 6.5  Hemoglobin 13.2 - 17.1 g/dL 14.0 14.0 13.6  Hematocrit 38.5 - 50.0 % 41.9 41.1 40.3  Platelets 140 - 400 K/uL 216 204 217   CMP Latest Ref Rng & Units 02/04/2017 10/29/2016 07/29/2016  Glucose 65 - 99 mg/dL 87 85 93  BUN 7 - 25 mg/dL 14 14 13   Creatinine 0.60 - 1.35 mg/dL 0.76 0.82 0.79  Sodium 135 - 146 mmol/L 141 140 141  Potassium 3.5 - 5.3 mmol/L 4.0 4.1 4.0  Chloride 98 - 110 mmol/L 108 108 105  CO2 20 - 31 mmol/L 24 27 28   Calcium 8.6 - 10.3 mg/dL 9.0 9.4 9.2  Total Protein 6.1 - 8.1 g/dL 6.4 6.7 6.9  Total Bilirubin 0.2 - 1.2 mg/dL 0.5 0.4 0.4  Alkaline Phos 40 - 115 U/L 35(L) 38(L) 35(L)  AST 10 - 40 U/L 12 14 12   ALT 9 - 46 U/L 9 11  8(L)    Imaging: No results found.  Speciality Comments: No specialty comments available.    Procedures:  No procedures performed Allergies: Patient has no known allergies.   Assessment / Plan:     Visit Diagnoses: Seropositive rheumatoid arthritis ([place]) +RF +CCP severe disease with synovitis  - US exam in 12/2016 revealed only mild synovitis.He had no clinical synovitis. He is does have mild arthralgias. At this point he is satisfied with the current treatment and does not want to change therapy.  High risk medication use - Methotrexate 5 tablets by mouth twice a week, folic acid 2 mg by mouth daily and Plaquenil 200 twice a day . His labs have been stable. Last eye exam was September 2017 at [facility]. He will get his eye exam this year.  Pigmentation around his fingernails were noted. I'm uncertain if this is related to Plaquenil  or not. We will observe it for right now.  Primary osteoarthritis of both hands: Some stiffness  Flexion contracture of bilateral elbows stable with no synovitis.  Chondromalacia of both patellae: Is not causing any concern currently  Primary osteoarthritis of both feet: Some discomfort  Plantar fasciitis: Better he's been going to a chiropractor  History of goiter  History of hyperlipidemia  Bilateral hearing loss, unspecified hearing loss type    Orders: No orders of the defined types were placed in this encounter.  No orders of the defined types were placed in this encounter.   Face-to-face time spent with patient was 30 minutes. 50% of time was spent in counseling and coordination of care.  Follow-Up Instructions: Return in about 5 months (around 09/14/2017) for Rheumatoid arthritis.   Bo Merino, MD  Note - This record has been created using [employer].  Chart creation errors have been sought, but may not always  have been located. Such creation errors do not reflect on  the standard of medical care.

## 2017-04-14 ENCOUNTER — Encounter: Payer: Self-pay | Admitting: Rheumatology

## 2017-04-14 ENCOUNTER — Ambulatory Visit (INDEPENDENT_AMBULATORY_CARE_PROVIDER_SITE_OTHER): Payer: BC Managed Care – PPO | Admitting: Rheumatology

## 2017-04-14 VITALS — BP 115/76 | HR 59 | Ht 74.0 in | Wt 211.0 lb

## 2017-04-14 DIAGNOSIS — M19042 Primary osteoarthritis, left hand: Secondary | ICD-10-CM

## 2017-04-14 DIAGNOSIS — M19072 Primary osteoarthritis, left ankle and foot: Secondary | ICD-10-CM

## 2017-04-14 DIAGNOSIS — M24522 Contracture, left elbow: Secondary | ICD-10-CM

## 2017-04-14 DIAGNOSIS — M722 Plantar fascial fibromatosis: Secondary | ICD-10-CM | POA: Diagnosis not present

## 2017-04-14 DIAGNOSIS — Z8639 Personal history of other endocrine, nutritional and metabolic disease: Secondary | ICD-10-CM

## 2017-04-14 DIAGNOSIS — M19041 Primary osteoarthritis, right hand: Secondary | ICD-10-CM

## 2017-04-14 DIAGNOSIS — M2242 Chondromalacia patellae, left knee: Secondary | ICD-10-CM | POA: Diagnosis not present

## 2017-04-14 DIAGNOSIS — Z79899 Other long term (current) drug therapy: Secondary | ICD-10-CM | POA: Diagnosis not present

## 2017-04-14 DIAGNOSIS — H9193 Unspecified hearing loss, bilateral: Secondary | ICD-10-CM

## 2017-04-14 DIAGNOSIS — M19071 Primary osteoarthritis, right ankle and foot: Secondary | ICD-10-CM

## 2017-04-14 DIAGNOSIS — M059 Rheumatoid arthritis with rheumatoid factor, unspecified: Secondary | ICD-10-CM | POA: Diagnosis not present

## 2017-04-14 DIAGNOSIS — M24521 Contracture, right elbow: Secondary | ICD-10-CM

## 2017-04-14 DIAGNOSIS — M2241 Chondromalacia patellae, right knee: Secondary | ICD-10-CM

## 2017-04-14 NOTE — Patient Instructions (Signed)
Standing Labs We placed an order today for your standing lab work.    Please come back and get your standing labs in August and every 3 months  We have open lab Monday through Friday from 8:30-11:30 AM and 1:30-4 PM at the office of Dr. Bo Merino.   The office is located at [address] [place], [place], [place] [address] No appointment is necessary.   Labs are drawn by [facility].  You may receive a bill from [facility] for your lab work. If you have any questions regarding directions or hours of operation,  please call [phone].

## 2017-05-13 ENCOUNTER — Other Ambulatory Visit: Payer: Self-pay | Admitting: Rheumatology

## 2017-05-13 NOTE — Telephone Encounter (Signed)
Last Visit: 04/14/17 Next Visit: 09/23/17 Labs: 02/04/17 decreased Alkaline Phosphatase   Left message to advise patient he is due for labs.  Okay to refill 30 supply per Dr. Estanislado Pandy

## 2017-05-30 ENCOUNTER — Telehealth: Payer: Self-pay | Admitting: Rheumatology

## 2017-05-30 NOTE — Telephone Encounter (Signed)
Patient going to [facility] for labs, on [address], Wednesday. Please send orders.

## 2017-05-31 ENCOUNTER — Other Ambulatory Visit: Payer: Self-pay | Admitting: *Deleted

## 2017-05-31 DIAGNOSIS — Z79899 Other long term (current) drug therapy: Secondary | ICD-10-CM

## 2017-05-31 NOTE — Telephone Encounter (Signed)
Labs released.  

## 2017-06-01 LAB — COMPLETE METABOLIC PANEL WITH GFR
AG RATIO: 2.4 (calc) (ref 1.0–2.5)
ALKALINE PHOSPHATASE (APISO): 36 U/L — AB (ref 40–115)
ALT: 11 U/L (ref 9–46)
AST: 12 U/L (ref 10–40)
Albumin: 4.6 g/dL (ref 3.6–5.1)
BILIRUBIN TOTAL: 0.6 mg/dL (ref 0.2–1.2)
BUN: 14 mg/dL (ref 7–25)
CALCIUM: 9.2 mg/dL (ref 8.6–10.3)
CHLORIDE: 108 mmol/L (ref 98–110)
CO2: 29 mmol/L (ref 20–32)
Creat: 0.77 mg/dL (ref 0.60–1.35)
GFR, EST NON AFRICAN AMERICAN: 107 mL/min/{1.73_m2} (ref >=60)
GFR, Est African American: 123 mL/min/{1.73_m2} (ref >=60)
GLOBULIN: 1.9 g/dL (ref 1.9–3.7)
Glucose, Bld: 69 mg/dL (ref 65–139)
POTASSIUM: 4 mmol/L (ref 3.5–5.3)
Sodium: 143 mmol/L (ref 135–146)
Total Protein: 6.5 g/dL (ref 6.1–8.1)

## 2017-06-01 LAB — CBC WITH DIFFERENTIAL/PLATELET
BASOS ABS: 48 {cells}/uL (ref 0–200)
Basophils Relative: 0.9 %
EOS PCT: 1.7 %
Eosinophils Absolute: 90 cells/uL (ref 15–500)
HCT: 40.8 % (ref 38.5–50.0)
Hemoglobin: 13.9 g/dL (ref 13.2–17.1)
Lymphs Abs: 1129 cells/uL (ref 850–3900)
MCH: 30 pg (ref 27.0–33.0)
MCHC: 34.1 g/dL (ref 32.0–36.0)
MCV: 88.1 fL (ref 80.0–100.0)
MPV: 10.3 fL (ref 7.5–12.5)
Monocytes Relative: 7.7 %
NEUTROS ABS: 3625 {cells}/uL (ref 1500–7800)
NEUTROS PCT: 68.4 %
PLATELETS: 212 10*3/uL (ref 140–400)
RBC: 4.63 10*6/uL (ref 4.20–5.80)
RDW: 14.1 % (ref 11.0–15.0)
TOTAL LYMPHOCYTE: 21.3 %
WBC mixed population: 408 cells/uL (ref 200–950)
WBC: 5.3 10*3/uL (ref 3.8–10.8)

## 2017-06-01 NOTE — Progress Notes (Signed)
WNLs

## 2017-06-12 ENCOUNTER — Other Ambulatory Visit: Payer: Self-pay | Admitting: Rheumatology

## 2017-06-13 NOTE — Telephone Encounter (Signed)
Last Visit: 04/14/17 Next Visit: 09/23/17 Labs: 06/01/17 WNL  Okay to refill per Dr. Estanislado Pandy

## 2017-07-13 ENCOUNTER — Other Ambulatory Visit: Payer: Self-pay | Admitting: Rheumatology

## 2017-07-13 NOTE — Telephone Encounter (Signed)
Last Visit: 04/14/17 Next Visit: 09/23/17 Labs: 06/01/17 WNL  Okay to refill per Dr. Estanislado Pandy

## 2017-09-11 NOTE — Progress Notes (Signed)
Office Visit Note  Patient: Christian Gentry             Date of Birth: Mar 15, 1966           MRN: [id number]             PCP: Christain Sacramento, MD Referring: Christain Sacramento, MD Visit Date: 09/23/2017 Occupation: @GUAROCC @    Subjective:  Other (toe pain )   History of Present Illness: Christian Andoni Busch is a 51 y.o. male with history of sero positive rheumatoid arthritis. He states she's been doing fairly well on combination of methotrexate and Plaquenil. He does continue to have some stiffness in his hands. He states she's been having some discomfort in his feet as well. He's been told is from nerve irritation. He's been going to a chiropractor every 2 weeks form foot massage which helps. He has not noticed any visible swelling.  Activities of Daily Living:  Patient reports morning stiffness for 0 minute.   Patient Denies nocturnal pain.  Difficulty dressing/grooming: Denies Difficulty climbing stairs: Denies Difficulty getting out of chair: Denies Difficulty using hands for taps, buttons, cutlery, and/or writing: Denies   Review of Systems  Constitutional: Negative for fatigue, night sweats and weakness ( ).  HENT: Negative for mouth sores, mouth dryness and nose dryness.   Eyes: Negative for redness and dryness.  Respiratory: Negative for shortness of breath and difficulty breathing.   Cardiovascular: Negative for chest pain, palpitations, hypertension, irregular heartbeat and swelling in legs/feet.  Gastrointestinal: Negative for constipation and diarrhea.  Endocrine: Negative for increased urination.  Musculoskeletal: Positive for arthralgias and joint pain. Negative for joint swelling, myalgias, muscle weakness, morning stiffness, muscle tenderness and myalgias.  Skin: Negative for color change, rash, hair loss, nodules/bumps, skin tightness, ulcers and sensitivity to sunlight.  Allergic/Immunologic: Negative for susceptible to infections.  Neurological: Negative for  dizziness, fainting, memory loss and night sweats.  Hematological: Negative for swollen glands.  Psychiatric/Behavioral: Negative for depressed mood and sleep disturbance. The patient is not nervous/anxious.     PMFS History:  Patient Active Problem List   Diagnosis Date Noted  . 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 Seropositive rheumatoid arthritis ([place]) 09/03/2016  . High risk medication use 09/03/2016  . Primary osteoarthritis of both hands 09/03/2016  . Primary osteoarthritis of both feet 09/03/2016  . Flexion contracture of elbow 09/03/2016  . 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 Plantar fasciitis 09/03/2016  . Dyslipidemia 09/03/2016  . Goiter 09/03/2016  . Bilateral hearing loss 09/03/2016  . History of neutropenia 09/03/2016    Past Medical History:  Diagnosis Date  . Hearing difficulty   . Hypercholesteremia   . Rash   . Thyroid disease     Family History  Problem Relation Age of Onset  . Cancer Father        prostate,lung  . Cancer Unknown   . Seizures Unknown   . Thyroid disease Unknown    Past Surgical History:  Procedure Laterality Date  . THYROID LOBECTOMY  10/21/11  . THYROID LOBECTOMY  10/21/2011   Procedure: THYROID LOBECTOMY;  Surgeon: Haywood Lasso, MD;  Location: [facility];  Service: General;  Laterality: Left;  Removal left lobe of thyroid   Social History   Social History Narrative  . Not on file     Objective: Vital Signs: BP 116/74 (BP Location: Left Arm, Patient Position: Sitting, Cuff Size: Normal)   Pulse 78   Resp 16  Ht 6\' 2"  (1.88 m)   Wt 210 lb (95.3 kg)   BMI 26.96 kg/m    Physical Exam  Constitutional: He is oriented to person, place, and time. He appears well-developed and well-nourished.  HENT:  Head: Normocephalic and atraumatic.  Eyes: Conjunctivae and EOM are normal. Pupils are equal, round, and reactive to light.  Neck: Normal range of motion. Neck supple.  Cardiovascular: Normal rate, regular  rhythm and normal heart sounds.  Pulmonary/Chest: Effort normal and breath sounds normal.  Abdominal: Soft. Bowel sounds are normal.  Neurological: He is alert and oriented to person, place, and time.  Skin: Skin is warm and dry. Capillary refill takes less than 2 seconds.  Psychiatric: He has a normal mood and affect. His behavior is normal.  Nursing note and vitals reviewed.    Musculoskeletal Exam: C-spine and thoracic lumbar spine good range of motion. Shoulder joints were in  good most range of motion. He has bilateral elbow joint contractures. wrist joint MCPs PIPs DIPs with good range of motion. Hip joints knee joints ankles MTPs PIPs DIPs with good range of motion. He is some tenderness on his MTPs as described above. No synovitis was noted.  CDAI Exam: CDAI Homunculus Exam:   Tenderness:  Right hand: 2nd MCP Right foot: 4th MTP Left foot: 4th MTP  Joint Counts:  CDAI Tender Joint count: 1 CDAI Swollen Joint count: 0  Global Assessments:  Patient Global Assessment: 2 Provider Global Assessment: 2  CDAI Calculated Score: 5    Investigation: No additional findings.PLQ eye exam: 06/18/2016 CBC Latest Ref Rng & Units 06/01/2017 02/04/2017 10/29/2016  WBC 3.8 - 10.8 Thousand/uL 5.3 5.9 5.6  Hemoglobin 13.2 - 17.1 g/dL 13.9 14.0 14.0  Hematocrit 38.5 - 50.0 % 40.8 41.9 41.1  Platelets 140 - 400 Thousand/uL 212 216 204   CMP Latest Ref Rng & Units 06/01/2017 02/04/2017 10/29/2016  Glucose 65 - 139 mg/dL 69 87 85  BUN 7 - 25 mg/dL 14 14 14   Creatinine 0.60 - 1.35 mg/dL 0.77 0.76 0.82  Sodium 135 - 146 mmol/L 143 141 140  Potassium 3.5 - 5.3 mmol/L 4.0 4.0 4.1  Chloride 98 - 110 mmol/L 108 108 108  CO2 20 - 32 mmol/L 29 24 27   Calcium 8.6 - 10.3 mg/dL 9.2 9.0 9.4  Total Protein 6.1 - 8.1 g/dL 6.5 6.4 6.7  Total Bilirubin 0.2 - 1.2 mg/dL 0.6 0.5 0.4  Alkaline Phos 40 - 115 U/L - 35(L) 38(L)  AST 10 - 40 U/L 12 12 14   ALT 9 - 46 U/L 11 9 11     Imaging: No results  found.  Speciality Comments: PLQ EYE Exam: 08/26/17 WNL @  [facility]    Procedures:  No procedures performed Allergies: Patient has no known allergies.   Assessment / Plan:     Visit Diagnoses: Seropositive rheumatoid arthritis ([place]) - +RF +CCP severe disease with synovitis  - US exam in 12/2016 revealed only mild synovitis. (Pigmentation around his fingernails were noted 04/14/2017 which persists. We discussed that it could be related to Plaquenil but I'm uncertain.) -He has been having some arthralgias but no synovitis was noted. Plan: Sedimentation rate, Cyclic citrul peptide antibody, IgG  High risk medication use - Methotrexate 5 tablets by mouth twice a week, folic acid 2 mg by mouth daily and Plaquenil 200 twice a dayeye exam: 06/18/2016 - Plan: CBC with Differential/Platelet, COMPLETE METABOLIC PANEL WITH GFR today and then every 3 months to monitor for drug  toxicity. Patient reports that his most recent eye exam in 2018 was normal.  Pain in both hands . He has been experiencing some stiffness and discomfort but no synovitis was noted.- Plan: XR Hand 2 View Right, XR Hand 2 View Left  Pain in both feet . He's been experiencing some stiffness and pain in his feet.- Plan: XR Foot 2 Views Right, XR Foot 2 Views Left  Primary osteoarthritis of both hands  Primary osteoarthritis of both feet  Flexion contracture of right elbow  Flexion contracture of left elbow  Chondromalacia of both patellae  Bilateral hearing loss, unspecified hearing loss type  Dyslipidemia  History of goiter  History of neutropenia: His WBC count has improved.   Orders: Orders Placed This Encounter  Procedures  . XR Hand 2 View Right  . XR Hand 2 View Left  . XR Foot 2 Views Right  . XR Foot 2 Views Left  . CBC with Differential/Platelet  . COMPLETE METABOLIC PANEL WITH GFR  . Sedimentation rate  . Cyclic citrul peptide antibody, IgG   No orders of the defined types were placed in this  encounter.   Face-to-face time spent with patient was 30 minutes. Greater than 50% of time was spent in counseling and coordination of care.  Follow-Up Instructions: Return in about 5 months (around 02/21/2018) for Rheumatoid arthritis.   Bo Merino, MD  Note - This record has been created using Editor, commissioning.  Chart creation errors have been sought, but may not always  have been located. Such creation errors do not reflect on  the standard of medical care.

## 2017-09-23 ENCOUNTER — Ambulatory Visit (INDEPENDENT_AMBULATORY_CARE_PROVIDER_SITE_OTHER): Payer: Self-pay

## 2017-09-23 ENCOUNTER — Ambulatory Visit: Payer: BC Managed Care – PPO | Admitting: Rheumatology

## 2017-09-23 ENCOUNTER — Encounter: Payer: Self-pay | Admitting: Rheumatology

## 2017-09-23 VITALS — BP 116/74 | HR 78 | Resp 16 | Ht 74.0 in | Wt 210.0 lb

## 2017-09-23 DIAGNOSIS — M059 Rheumatoid arthritis with rheumatoid factor, unspecified: Secondary | ICD-10-CM

## 2017-09-23 DIAGNOSIS — M79641 Pain in right hand: Secondary | ICD-10-CM | POA: Diagnosis not present

## 2017-09-23 DIAGNOSIS — H9193 Unspecified hearing loss, bilateral: Secondary | ICD-10-CM | POA: Diagnosis not present

## 2017-09-23 DIAGNOSIS — M2241 Chondromalacia patellae, right knee: Secondary | ICD-10-CM

## 2017-09-23 DIAGNOSIS — M19072 Primary osteoarthritis, left ankle and foot: Secondary | ICD-10-CM | POA: Diagnosis not present

## 2017-09-23 DIAGNOSIS — M79642 Pain in left hand: Secondary | ICD-10-CM | POA: Diagnosis not present

## 2017-09-23 DIAGNOSIS — M19071 Primary osteoarthritis, right ankle and foot: Secondary | ICD-10-CM

## 2017-09-23 DIAGNOSIS — M19041 Primary osteoarthritis, right hand: Secondary | ICD-10-CM | POA: Diagnosis not present

## 2017-09-23 DIAGNOSIS — Z8639 Personal history of other endocrine, nutritional and metabolic disease: Secondary | ICD-10-CM | POA: Diagnosis not present

## 2017-09-23 DIAGNOSIS — M79671 Pain in right foot: Secondary | ICD-10-CM

## 2017-09-23 DIAGNOSIS — M24522 Contracture, left elbow: Secondary | ICD-10-CM

## 2017-09-23 DIAGNOSIS — M24521 Contracture, right elbow: Secondary | ICD-10-CM | POA: Diagnosis not present

## 2017-09-23 DIAGNOSIS — M2242 Chondromalacia patellae, left knee: Secondary | ICD-10-CM

## 2017-09-23 DIAGNOSIS — M19042 Primary osteoarthritis, left hand: Secondary | ICD-10-CM | POA: Diagnosis not present

## 2017-09-23 DIAGNOSIS — Z862 Personal history of diseases of the blood and blood-forming organs and certain disorders involving the immune mechanism: Secondary | ICD-10-CM

## 2017-09-23 DIAGNOSIS — Z79899 Other long term (current) drug therapy: Secondary | ICD-10-CM | POA: Diagnosis not present

## 2017-09-23 DIAGNOSIS — M79672 Pain in left foot: Secondary | ICD-10-CM | POA: Diagnosis not present

## 2017-09-23 DIAGNOSIS — E785 Hyperlipidemia, unspecified: Secondary | ICD-10-CM

## 2017-09-23 NOTE — Patient Instructions (Signed)
Standing Labs We placed an order today for your standing lab work.    Please come back and get your standing labs in April and every 3 months  We have open lab Monday through Friday from 8:30-11:30 AM and 1:30-4 PM at the office of Dr. Lotus Gover.   The office is located at [address], [address], [place], [place] [address] No appointment is necessary.   Labs are drawn by [facility].  You may receive a bill from [facility] for your lab work. If you have any questions regarding directions or hours of operation,  please call [phone].    

## 2017-09-26 LAB — COMPLETE METABOLIC PANEL WITH GFR
AG RATIO: 2.2 (calc) (ref 1.0–2.5)
ALT: 10 U/L (ref 9–46)
AST: 12 U/L (ref 10–35)
Albumin: 4.8 g/dL (ref 3.6–5.1)
Alkaline phosphatase (APISO): 39 U/L — ABNORMAL LOW (ref 40–115)
BILIRUBIN TOTAL: 0.4 mg/dL (ref 0.2–1.2)
BUN: 15 mg/dL (ref 7–25)
CHLORIDE: 104 mmol/L (ref 98–110)
CO2: 31 mmol/L (ref 20–32)
Calcium: 9.3 mg/dL (ref 8.6–10.3)
Creat: 0.84 mg/dL (ref 0.70–1.33)
GFR, EST AFRICAN AMERICAN: 118 mL/min/{1.73_m2} (ref >=60)
GFR, Est Non African American: 102 mL/min/{1.73_m2} (ref >=60)
Globulin: 2.2 g/dL (calc) (ref 1.9–3.7)
Glucose, Bld: 97 mg/dL (ref 65–99)
POTASSIUM: 4.5 mmol/L (ref 3.5–5.3)
Sodium: 142 mmol/L (ref 135–146)
TOTAL PROTEIN: 7 g/dL (ref 6.1–8.1)

## 2017-09-26 LAB — CBC WITH DIFFERENTIAL/PLATELET
BASOS ABS: 48 {cells}/uL (ref 0–200)
Basophils Relative: 0.7 %
EOS PCT: 0.7 %
Eosinophils Absolute: 48 cells/uL (ref 15–500)
HCT: 43.7 % (ref 38.5–50.0)
HEMOGLOBIN: 15 g/dL (ref 13.2–17.1)
Lymphs Abs: 1449 cells/uL (ref 850–3900)
MCH: 30.1 pg (ref 27.0–33.0)
MCHC: 34.3 g/dL (ref 32.0–36.0)
MCV: 87.6 fL (ref 80.0–100.0)
MONOS PCT: 6.2 %
MPV: 10.8 fL (ref 7.5–12.5)
NEUTROS ABS: 4927 {cells}/uL (ref 1500–7800)
Neutrophils Relative %: 71.4 %
PLATELETS: 246 10*3/uL (ref 140–400)
RBC: 4.99 10*6/uL (ref 4.20–5.80)
RDW: 14.1 % (ref 11.0–15.0)
TOTAL LYMPHOCYTE: 21 %
WBC mixed population: 428 cells/uL (ref 200–950)
WBC: 6.9 10*3/uL (ref 3.8–10.8)

## 2017-09-26 LAB — SEDIMENTATION RATE: Sed Rate: 2 mm/h (ref 0–15)

## 2017-09-26 LAB — PHOSPHORUS: PHOSPHORUS: 3.4 mg/dL (ref 2.5–4.5)

## 2017-09-26 LAB — TEST AUTHORIZATION

## 2017-09-26 LAB — CYCLIC CITRUL PEPTIDE ANTIBODY, IGG: Cyclic Citrullin Peptide Ab: >250 UNITS — ABNORMAL HIGH

## 2017-09-26 NOTE — Progress Notes (Signed)
Labs are stable. Alkaline phosphatase is low. Please add phosphorous. Anti-CCP levels are still elevated.

## 2017-09-26 NOTE — Progress Notes (Signed)
No change in therapy at this point.

## 2017-10-18 ENCOUNTER — Other Ambulatory Visit: Payer: Self-pay | Admitting: Rheumatology

## 2017-10-18 NOTE — Telephone Encounter (Signed)
Last visit: 09/23/2017 Next visit: 03/10/2018 Labs: 09/23/2017 stable   Okay to refill per Dr. Estanislado Pandy.

## 2017-10-30 ENCOUNTER — Other Ambulatory Visit: Payer: Self-pay | Admitting: Rheumatology

## 2017-10-31 NOTE — Telephone Encounter (Signed)
Last visit: 09/23/2017 Next visit: 03/10/2018 Labs: 09/23/2017 stable PLQ Eye Exam: 08/26/17 WNL   Okay to refill per Dr. Estanislado Pandy.

## 2017-11-04 ENCOUNTER — Telehealth: Payer: Self-pay | Admitting: Rheumatology

## 2017-11-04 NOTE — Telephone Encounter (Signed)
Patient called stating that he has flu like symptoms and is not sure if he can take his current medication with the symptoms.  Patient states he is taking Methotrexate, Plaquenil, and Folic acid.  Patient also states he has difficulty sleeping at night due to his legs hurting when he lies down.

## 2017-11-04 NOTE — Telephone Encounter (Signed)
Left message to advise patient to hold MTX until his symptoms resolve. Also advised patient to see PCP for lower extremity discomfort.

## 2017-11-04 NOTE — Telephone Encounter (Signed)
Patient left a voicemail stating that he got the message about not taking the Methotrexate.  Patient states he is also taking the Plaquenil and Folic Acid and wants to know if he should stop taking those medications also.

## 2017-11-04 NOTE — Telephone Encounter (Signed)
Patient advised to only stop the MTX. Patient verbalized understanding.

## 2017-11-04 NOTE — Telephone Encounter (Signed)
He should hold off methotrexate until his symptoms resolved.  He should see his PCP for lower extremity discomfort.

## 2018-01-21 ENCOUNTER — Other Ambulatory Visit: Payer: Self-pay | Admitting: Rheumatology

## 2018-01-23 NOTE — Telephone Encounter (Addendum)
Last visit: 09/23/2017 Next visit: 03/10/2018 Labs: 09/23/2017 stable  Patient advised he is due to update labs. Patient will update this week.   Okay to refill 30 day supply per Dr. Estanislado Pandy

## 2018-02-01 ENCOUNTER — Other Ambulatory Visit: Payer: Self-pay

## 2018-02-01 DIAGNOSIS — Z79899 Other long term (current) drug therapy: Secondary | ICD-10-CM

## 2018-02-02 LAB — COMPLETE METABOLIC PANEL WITH GFR
AG RATIO: 2.3 (calc) (ref 1.0–2.5)
ALBUMIN MSPROF: 4.4 g/dL (ref 3.6–5.1)
ALT: 12 U/L (ref 9–46)
AST: 12 U/L (ref 10–35)
Alkaline phosphatase (APISO): 34 U/L — ABNORMAL LOW (ref 40–115)
BILIRUBIN TOTAL: 0.4 mg/dL (ref 0.2–1.2)
BUN: 15 mg/dL (ref 7–25)
CALCIUM: 9.2 mg/dL (ref 8.6–10.3)
CO2: 31 mmol/L (ref 20–32)
CREATININE: 0.84 mg/dL (ref 0.70–1.33)
Chloride: 106 mmol/L (ref 98–110)
GFR, EST NON AFRICAN AMERICAN: 102 mL/min/{1.73_m2} (ref >=60)
GFR, Est African American: 118 mL/min/{1.73_m2} (ref >=60)
GLOBULIN: 1.9 g/dL (ref 1.9–3.7)
Glucose, Bld: 71 mg/dL (ref 65–99)
Potassium: 4.5 mmol/L (ref 3.5–5.3)
SODIUM: 142 mmol/L (ref 135–146)
TOTAL PROTEIN: 6.3 g/dL (ref 6.1–8.1)

## 2018-02-02 LAB — CBC WITH DIFFERENTIAL/PLATELET
BASOS PCT: 0.8 %
Basophils Absolute: 38 cells/uL (ref 0–200)
EOS ABS: 110 {cells}/uL (ref 15–500)
Eosinophils Relative: 2.3 %
HEMATOCRIT: 39.4 % (ref 38.5–50.0)
Hemoglobin: 13.5 g/dL (ref 13.2–17.1)
Lymphs Abs: 1147 cells/uL (ref 850–3900)
MCH: 29.5 pg (ref 27.0–33.0)
MCHC: 34.3 g/dL (ref 32.0–36.0)
MCV: 86 fL (ref 80.0–100.0)
MPV: 10.3 fL (ref 7.5–12.5)
Monocytes Relative: 8.8 %
Neutro Abs: 3082 cells/uL (ref 1500–7800)
Neutrophils Relative %: 64.2 %
Platelets: 215 10*3/uL (ref 140–400)
RBC: 4.58 10*6/uL (ref 4.20–5.80)
RDW: 13.8 % (ref 11.0–15.0)
Total Lymphocyte: 23.9 %
WBC: 4.8 10*3/uL (ref 3.8–10.8)
WBCMIX: 422 {cells}/uL (ref 200–950)

## 2018-02-02 LAB — TEST AUTHORIZATION

## 2018-02-02 LAB — PHOSPHORUS: Phosphorus: 3 mg/dL (ref 2.5–4.5)

## 2018-02-12 ENCOUNTER — Other Ambulatory Visit: Payer: Self-pay | Admitting: Rheumatology

## 2018-02-13 NOTE — Telephone Encounter (Signed)
Last visit: 09/23/2017 Next visit: 03/10/2018  Okay to refill per Dr. Estanislado Pandy

## 2018-02-27 NOTE — Progress Notes (Deleted)
Office Visit Note  Patient: Christian Gentry             Date of Birth: 1966/07/14           MRN: [id number]             PCP: Christain Sacramento, MD Referring: Christain Sacramento, MD Visit Date: 03/10/2018 Occupation: @GUAROCC @    Subjective:  No chief complaint on file.   History of Present Illness: Christian Colter Magowan is a 52 y.o. male ***   Activities of Daily Living:  Patient reports morning stiffness for *** {minute/hour:19697}.   Patient {ACTIONS;DENIES/REPORTS:21021675::"Denies"} nocturnal pain.  Difficulty dressing/grooming: {ACTIONS;DENIES/REPORTS:21021675::"Denies"} Difficulty climbing stairs: {ACTIONS;DENIES/REPORTS:21021675::"Denies"} Difficulty getting out of chair: {ACTIONS;DENIES/REPORTS:21021675::"Denies"} Difficulty using hands for taps, buttons, cutlery, and/or writing: {ACTIONS;DENIES/REPORTS:21021675::"Denies"}   No Rheumatology ROS completed.   PMFS History:  Patient Active Problem List   Diagnosis Date Noted  . 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 Seropositive rheumatoid arthritis ([place]) 09/03/2016  . High risk medication use 09/03/2016  . Primary osteoarthritis of both hands 09/03/2016  . Primary osteoarthritis of both feet 09/03/2016  . Flexion contracture of elbow 09/03/2016  . 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 Plantar fasciitis 09/03/2016  . Dyslipidemia 09/03/2016  . Goiter 09/03/2016  . Bilateral hearing loss 09/03/2016  . History of neutropenia 09/03/2016    Past Medical History:  Diagnosis Date  . Hearing difficulty   . Hypercholesteremia   . Rash   . Thyroid disease     Family History  Problem Relation Age of Onset  . Cancer Father        prostate,lung  . Cancer Unknown   . Seizures Unknown   . Thyroid disease Unknown    Past Surgical History:  Procedure Laterality Date  . THYROID LOBECTOMY  10/21/11  . THYROID LOBECTOMY  10/21/2011   Procedure: THYROID LOBECTOMY;  Surgeon: Haywood Lasso, MD;  Location: [facility];  Service: General;  Laterality: Left;  Removal left lobe of thyroid   Social History   Social History Narrative  . Not on file     Objective: Vital Signs: There were no vitals taken for this visit.   Physical Exam   Musculoskeletal Exam: ***  CDAI Exam: No CDAI exam completed.    Investigation: No additional findings.PLQ eye exam: 08/26/2017 CBC Latest Ref Rng & Units 02/01/2018 09/23/2017 06/01/2017  WBC 3.8 - 10.8 Thousand/uL 4.8 6.9 5.3  Hemoglobin 13.2 - 17.1 g/dL 13.5 15.0 13.9  Hematocrit 38.5 - 50.0 % 39.4 43.7 40.8  Platelets 140 - 400 Thousand/uL 215 246 212   CMP Latest Ref Rng & Units 02/01/2018 09/23/2017 06/01/2017  Glucose 65 - 99 mg/dL 71 97 69  BUN 7 - 25 mg/dL 15 15 14   Creatinine 0.70 - 1.33 mg/dL 0.84 0.84 0.77  Sodium 135 - 146 mmol/L 142 142 143  Potassium 3.5 - 5.3 mmol/L 4.5 4.5 4.0  Chloride 98 - 110 mmol/L 106 104 108  CO2 20 - 32 mmol/L 31 31 29   Calcium 8.6 - 10.3 mg/dL 9.2 9.3 9.2  Total Protein 6.1 - 8.1 g/dL 6.3 7.0 6.5  Total Bilirubin 0.2 - 1.2 mg/dL 0.4 0.4 0.6  Alkaline Phos 40 - 115 U/L - - -  AST 10 - 35 U/L 12 12 12   ALT 9 - 46 U/L 12 10 11     Imaging: No results found.  Speciality Comments: PLQ EYE Exam: 08/26/17 WNL @  [facility]    Procedures:  No procedures performed Allergies: Patient has no known allergies.   Assessment / Plan:     Visit Diagnoses: No diagnosis found.    Orders: No orders of the defined types were placed in this encounter.  No orders of the defined types were placed in this encounter.   Face-to-face time spent with patient was *** minutes. 50% of time was spent in counseling and coordination of care.  Follow-Up Instructions: No follow-ups on file.   Earnestine Mealing, CMA  Note - This record has been created using Editor, commissioning.  Chart creation errors have been sought, but may not always  have been located. Such creation errors do not reflect on  the standard of medical care.

## 2018-02-28 ENCOUNTER — Other Ambulatory Visit: Payer: Self-pay | Admitting: Rheumatology

## 2018-02-28 NOTE — Telephone Encounter (Signed)
Last visit: 09/23/2017 Next visit: 03/10/2018 Labs: 02/01/18 Alk phos low All other lab values are WNL. Phosphorus is WNL.  Okay to refill per Dr. Estanislado Pandy

## 2018-03-10 ENCOUNTER — Ambulatory Visit: Payer: BC Managed Care – PPO | Admitting: Rheumatology

## 2018-04-19 NOTE — Progress Notes (Signed)
Office Visit Note  Patient: Christian Gentry             Date of Birth: 1965/12/06           MRN: [id number]             PCP: Christain Sacramento, MD Referring: Christain Sacramento, MD Visit Date: 04/27/2018 Occupation: @GUAROCC @  Subjective:  Discuss medications   History of Present Illness: Christian Kenrick Pore is a 52 y.o. male with history of seropositive rheumatoid arthritis and osteoarthritis.  Patient is on MTX 5 tablets po twice a week, folic acid 2 mg daily, and PLQ 200 mg BID.  He denies any recent rheumatoid arthritis flares.  He states that he has been taking Plaquenil 1 tablet by mouth daily p.o. twice daily.  He has not noticed any difference since lowering his dose.  He denies any joint pain or joint swelling at this time.  He denies any joint stiffness.  He states he continues to have bilateral elbow contractures but no pain at this time.  He states that he has bilateral Morton's neuroma of the fourth toe.  He states in the past he had a cortisone injection which provided some relief but he does not want one at this time.  He states that he has bilateral eye dryness and eye redness but does not use eyedrops.  He states that he does get a vision test for Plaquenil eye exam once yearly.    Activities of Daily Living:  Patient reports morning stiffness for 0 none.   Patient Denies nocturnal pain.  Difficulty dressing/grooming: Denies Difficulty climbing stairs: Denies Difficulty getting out of chair: Denies Difficulty using hands for taps, buttons, cutlery, and/or writing: Denies  Review of Systems  Constitutional: Negative for fatigue and night sweats.  HENT: Negative for mouth sores, mouth dryness and nose dryness.   Eyes: Positive for redness and dryness. Negative for visual disturbance.  Respiratory: Negative for cough, hemoptysis, shortness of breath and difficulty breathing.   Cardiovascular: Negative for chest pain, palpitations, hypertension, irregular heartbeat and swelling  in legs/feet.  Gastrointestinal: Negative for blood in stool, constipation and diarrhea.  Endocrine: Negative for increased urination.  Genitourinary: Negative for difficulty urinating and painful urination.  Musculoskeletal: Positive for muscle tenderness. Negative for arthralgias, joint pain, joint swelling, myalgias, muscle weakness, morning stiffness and myalgias.  Skin: Negative for color change, rash, hair loss, nodules/bumps, skin tightness, ulcers and sensitivity to sunlight.  Allergic/Immunologic: Negative for susceptible to infections.  Neurological: Negative for dizziness, fainting, memory loss, night sweats and weakness.  Hematological: Negative for bruising/bleeding tendency and swollen glands.  Psychiatric/Behavioral: Negative for depressed mood and sleep disturbance. The patient is not nervous/anxious.     PMFS History:  Patient Active Problem List   Diagnosis Date Noted  . 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 Seropositive rheumatoid arthritis ([place]) 09/03/2016  . High risk medication use 09/03/2016  . Primary osteoarthritis of both hands 09/03/2016  . Primary osteoarthritis of both feet 09/03/2016  . Flexion contracture of elbow 09/03/2016  . 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 Plantar fasciitis 09/03/2016  . Dyslipidemia 09/03/2016  . Goiter 09/03/2016  . Bilateral hearing loss 09/03/2016  . History of neutropenia 09/03/2016    Past Medical History:  Diagnosis Date  . Hearing difficulty   . Hypercholesteremia   . Rash   . Rheumatoid arthritis ([place])   . Thyroid disease     Family History  Problem Relation Age of  Onset  . Cancer Father        prostate,lung  . Cancer Unknown   . Seizures Unknown   . Thyroid disease Unknown    Past Surgical History:  Procedure Laterality Date  . THYROID LOBECTOMY  10/21/11  . THYROID LOBECTOMY  10/21/2011   Procedure: THYROID LOBECTOMY;  Surgeon: Haywood Lasso, MD;  Location: [facility];  Service: General;   Laterality: Left;  Removal left lobe of thyroid   Social History   Social History Narrative  . Not on file    Objective: Vital Signs: BP 114/73 (BP Location: Left Arm, Patient Position: Sitting, Cuff Size: Normal)   Pulse 61   Resp 14   Ht 6\' 2"  (1.88 m)   Wt 201 lb 6.4 oz (91.4 kg)   BMI 25.86 kg/m    Physical Exam  Constitutional: He is oriented to person, place, and time. He appears well-developed and well-nourished.  HENT:  Head: Normocephalic and atraumatic.  Eyes: Pupils are equal, round, and reactive to light. Conjunctivae and EOM are normal.  Neck: Normal range of motion. Neck supple.  Cardiovascular: Normal rate, regular rhythm and normal heart sounds.  Pulmonary/Chest: Effort normal and breath sounds normal.  Abdominal: Soft. Bowel sounds are normal.  Lymphadenopathy:    He has no cervical adenopathy.  Neurological: He is alert and oriented to person, place, and time.  Skin: Skin is warm and dry. Capillary refill takes less than 2 seconds.  Psychiatric: He has a normal mood and affect. His behavior is normal.  Nursing note and vitals reviewed.    Musculoskeletal Exam: C-spine, thoracic spine, lumbar spine good range of motion.  No midline spinal tenderness.  No SI joint tenderness.  Shoulder joints full ROM with no discomfort.  Elbow joint contractures bilaterally.  No warmth or swelling of elbow joints.  Wrist joints, MCPs, PIPs, and DIPs good ROM with no synovitis. PIP and DIP synovial thickening consistent with osteoarthritis of both hands. Hip joints, knee joints, ankle joints, MTPs, PIPs, and DIPs good ROM with no synovitis.  No warmth or effusion of knee joints.  Bilateral knee crepitus.  No tenderness of trochanteric bursa bilaterally.    CDAI Exam: CDAI Homunculus Exam:   Joint Counts:  CDAI Tender Joint count: 0 CDAI Swollen Joint count: 0  Global Assessments:  Patient Global Assessment: 1 Provider Global Assessment: 1  CDAI Calculated Score:  2   Investigation: No additional findings.  Imaging: No results found.  Recent Labs: Lab Results  Component Value Date   WBC 4.8 02/01/2018   HGB 13.5 02/01/2018   PLT 215 02/01/2018   NA 142 02/01/2018   K 4.5 02/01/2018   CL 106 02/01/2018   CO2 31 02/01/2018   GLUCOSE 71 02/01/2018   BUN 15 02/01/2018   CREATININE 0.84 02/01/2018   BILITOT 0.4 02/01/2018   ALKPHOS 35 (L) 02/04/2017   AST 12 02/01/2018   ALT 12 02/01/2018   PROT 6.3 02/01/2018   ALBUMIN 4.1 02/04/2017   CALCIUM 9.2 02/01/2018   GFRAA 118 02/01/2018    Speciality Comments: PLQ EYE Exam: 08/26/17 WNL @  [place]  Procedures:  No procedures performed Allergies: Patient has no known allergies.   Assessment / Plan:     Visit Diagnoses: Seropositive rheumatoid arthritis ([place]) - +RF +CCP severe disease with synovitis  - US exam in 12/2016 revealed only mild synovitis: He has no synovitis on exam.  He has no joint pain or joint swelling.  He has  not had any recent rheumatoid arthritis flares.  He is clinically doing well on methotrexate 5 tablets by mouth twice a week and Plaquenil 200 mg 1 tablet daily.  He was prescribed Plaquenil 200 mg twice daily but is only been taking once a day.  He is also been noncompliant with folic acid use.  We discussed at length the importance of taking folic acid 2 mg by mouth daily.  He does not need any refills at this time.  Since he is clinically doing well we will lower his dose of methotrexate to 8 tablets by mouth once week and Plaquenil 200 mg 1 tablet p.o. Daily.  He was advised to notify us if he develops increased joint pain or joint swelling.  He will follow-up in 5 months.  High risk medication use - Methotrexate 8 tablets po once weekly, folic acid 2 mg by mouth daily and Plaquenil 200 one tablet daily. eye exam: 08/26/2017.  CBC and CMP were drawn today to monitor for drug toxicity.  He will return in November and every 3 months for lab work.- Plan: CBC with  Differential/Platelet, COMPLETE METABOLIC PANEL WITH GFR  Primary osteoarthritis of both hands: He has mild PIP and DIP synovial thickening consistent with osteoarthritis of both hands.  He has no synovitis or tenderness on exam.  Joint protection and muscle strengthening were discussed.   Primary osteoarthritis of both feet: He has mild osteoarthritic changes in both feet. He has no discomfort at this time. He wears proper fitting shoes.    Flexion contracture of left elbow: Chronic.  No synovitis or tenderness.  Flexion contracture of right elbow: Chronic.  He has no synovitis or tenderness.  Chondromalacia of both patellae: He has bilateral knee crepitus.  No warmth or effusion noted.  He has no discomfort in his knee joints at this time.  Morton's neuroma: He morton's neuromas bilaterally. He declined a cortisone injection today.   Other medical conditions are listed as follows:   History of neutropenia  Dyslipidemia  History of goiter  Bilateral hearing loss, unspecified hearing loss type   Orders: Orders Placed This Encounter  Procedures  . CBC with Differential/Platelet  . COMPLETE METABOLIC PANEL WITH GFR   No orders of the defined types were placed in this encounter.   Face-to-face time spent with patient was 30 minutes. Greater than 50% of time was spent in counseling and coordination of care.  Follow-Up Instructions: Return in about 5 months (around 09/27/2018) for Rheumatoid arthritis, Osteoarthritis.   Ofilia Neas, PA-C I examined and evaluated the patient with Hazel Sams PA.  Patient had no synovitis on my examination.  He wants to decrease the dose of methotrexate.  I was in agreement.  He will decrease methotrexate to 8 tablets p.o. weekly.  He will continue Plaquenil for right now.  The plan of care was discussed as noted above.  Bo Merino, MD Note - This record has been created using Editor, commissioning.  Chart creation errors have been sought, but  may not always  have been located. Such creation errors do not reflect on  the standard of medical care.

## 2018-04-27 ENCOUNTER — Encounter: Payer: Self-pay | Admitting: Rheumatology

## 2018-04-27 ENCOUNTER — Ambulatory Visit: Payer: BC Managed Care – PPO | Admitting: Rheumatology

## 2018-04-27 VITALS — BP 114/73 | HR 61 | Resp 14 | Ht 74.0 in | Wt 201.4 lb

## 2018-04-27 DIAGNOSIS — M19071 Primary osteoarthritis, right ankle and foot: Secondary | ICD-10-CM | POA: Diagnosis not present

## 2018-04-27 DIAGNOSIS — M19042 Primary osteoarthritis, left hand: Secondary | ICD-10-CM

## 2018-04-27 DIAGNOSIS — M19041 Primary osteoarthritis, right hand: Secondary | ICD-10-CM

## 2018-04-27 DIAGNOSIS — E785 Hyperlipidemia, unspecified: Secondary | ICD-10-CM

## 2018-04-27 DIAGNOSIS — M059 Rheumatoid arthritis with rheumatoid factor, unspecified: Secondary | ICD-10-CM

## 2018-04-27 DIAGNOSIS — M24522 Contracture, left elbow: Secondary | ICD-10-CM

## 2018-04-27 DIAGNOSIS — M19072 Primary osteoarthritis, left ankle and foot: Secondary | ICD-10-CM

## 2018-04-27 DIAGNOSIS — Z8639 Personal history of other endocrine, nutritional and metabolic disease: Secondary | ICD-10-CM

## 2018-04-27 DIAGNOSIS — Z862 Personal history of diseases of the blood and blood-forming organs and certain disorders involving the immune mechanism: Secondary | ICD-10-CM

## 2018-04-27 DIAGNOSIS — G5763 Lesion of plantar nerve, bilateral lower limbs: Secondary | ICD-10-CM

## 2018-04-27 DIAGNOSIS — M2241 Chondromalacia patellae, right knee: Secondary | ICD-10-CM

## 2018-04-27 DIAGNOSIS — M24521 Contracture, right elbow: Secondary | ICD-10-CM

## 2018-04-27 DIAGNOSIS — M2242 Chondromalacia patellae, left knee: Secondary | ICD-10-CM

## 2018-04-27 DIAGNOSIS — Z79899 Other long term (current) drug therapy: Secondary | ICD-10-CM | POA: Diagnosis not present

## 2018-04-27 DIAGNOSIS — H9193 Unspecified hearing loss, bilateral: Secondary | ICD-10-CM

## 2018-04-28 LAB — COMPLETE METABOLIC PANEL WITH GFR
AG Ratio: 2 (calc) (ref 1.0–2.5)
ALT: 21 U/L (ref 9–46)
AST: 15 U/L (ref 10–35)
Albumin: 4.7 g/dL (ref 3.6–5.1)
Alkaline phosphatase (APISO): 41 U/L (ref 40–115)
BILIRUBIN TOTAL: 0.6 mg/dL (ref 0.2–1.2)
BUN: 14 mg/dL (ref 7–25)
CALCIUM: 9.5 mg/dL (ref 8.6–10.3)
CO2: 29 mmol/L (ref 20–32)
Chloride: 103 mmol/L (ref 98–110)
Creat: 0.86 mg/dL (ref 0.70–1.33)
GFR, EST AFRICAN AMERICAN: 117 mL/min/{1.73_m2} (ref >=60)
GFR, Est Non African American: 101 mL/min/{1.73_m2} (ref >=60)
GLUCOSE: 92 mg/dL (ref 65–99)
Globulin: 2.4 g/dL (calc) (ref 1.9–3.7)
Potassium: 4.9 mmol/L (ref 3.5–5.3)
Sodium: 140 mmol/L (ref 135–146)
TOTAL PROTEIN: 7.1 g/dL (ref 6.1–8.1)

## 2018-04-28 LAB — CBC WITH DIFFERENTIAL/PLATELET
BASOS ABS: 69 {cells}/uL (ref 0–200)
BASOS PCT: 1.1 %
Eosinophils Absolute: 107 cells/uL (ref 15–500)
Eosinophils Relative: 1.7 %
HCT: 43.6 % (ref 38.5–50.0)
Hemoglobin: 14.6 g/dL (ref 13.2–17.1)
LYMPHS ABS: 1569 {cells}/uL (ref 850–3900)
MCH: 29.7 pg (ref 27.0–33.0)
MCHC: 33.5 g/dL (ref 32.0–36.0)
MCV: 88.6 fL (ref 80.0–100.0)
MONOS PCT: 6.3 %
MPV: 10.9 fL (ref 7.5–12.5)
NEUTROS ABS: 4158 {cells}/uL (ref 1500–7800)
Neutrophils Relative %: 66 %
PLATELETS: 231 10*3/uL (ref 140–400)
RBC: 4.92 10*6/uL (ref 4.20–5.80)
RDW: 14.4 % (ref 11.0–15.0)
TOTAL LYMPHOCYTE: 24.9 %
WBC mixed population: 397 cells/uL (ref 200–950)
WBC: 6.3 10*3/uL (ref 3.8–10.8)

## 2018-04-28 NOTE — Progress Notes (Signed)
Labs are WNL.

## 2018-05-28 ENCOUNTER — Other Ambulatory Visit: Payer: Self-pay | Admitting: Rheumatology

## 2018-05-29 NOTE — Telephone Encounter (Addendum)
Last Visit: 04/27/18 Next Visit: 09/29/18 Labs: 04/27/18 WNL  Okay to refill per Dr. Estanislado Pandy

## 2018-08-30 ENCOUNTER — Other Ambulatory Visit: Payer: Self-pay | Admitting: Rheumatology

## 2018-08-30 NOTE — Telephone Encounter (Signed)
Last Visit: 04/27/18 Next Visit: 09/29/18  Okay to refill per Dr. Estanislado Pandy

## 2018-08-31 ENCOUNTER — Other Ambulatory Visit: Payer: Self-pay | Admitting: Rheumatology

## 2018-08-31 NOTE — Telephone Encounter (Addendum)
Last Visit: 04/27/18 Next Visit: 09/29/18 Labs:04/27/18 WNL  Left message to advise patient he is due for labs.   Okay to refill 30 day supply per Dr. Estanislado Pandy

## 2018-09-15 NOTE — Progress Notes (Signed)
Office Visit Note  Patient: Christian Gentry             Date of Birth: 02-Nov-1965           MRN: [id number]             PCP: Christain Sacramento, MD Referring: Christain Sacramento, MD Visit Date: 09/29/2018 Occupation: @GUAROCC @  Subjective:  Medication monitoring   History of Present Illness: Christian Wilberth Damon is a 53 y.o. male with history of seropositive rheumatoid arthritis and osteoarthritis.  He is on PLQ 200 mg 1 tablet daily, MTX  8 tablets by mouth once weekly and folic acid 2 mg po daily.  He denies any recent rheumatoid arthritis flares.  He denies any joint pain or joint swelling at this time.  He denies any joint stiffness.  He has not missed any doses of methotrexate.  He states that he often misses the evening dose of Plaquenil and has been taking it only once daily.  He has not noticed any difference since lowering the dose of Plaquenil.  He denies any recent infections.  He did not receive the influenza vaccination this year.  He reports that he has Plaquenil eye exam couple weeks ago performed by Dr. Ronnald Ramp and it was normal.  He reports that he has been having more frequent headaches.  He states that the headaches typically occur early in the afternoon.  He states he occasionally experiences blurry vision with the headaches.  He states that headaches typically do not last long.  He takes Tylenol for pain relief if the headache is severe.  He states that the headaches do not seem to correlate with his methotrexate dose.  He reports he has not followed up with his primary care for further evaluation yet.  He reports that he has eye redness and eye dryness and uses over-the-counter eyedrops.    Activities of Daily Living:  Patient reports morning stiffness for 0 none.   Patient Denies nocturnal pain.  Difficulty dressing/grooming: Denies Difficulty climbing stairs: Denies Difficulty getting out of chair: Denies Difficulty using hands for taps, buttons, cutlery, and/or writing:  Denies  Review of Systems  Constitutional: Negative for fatigue and night sweats.  HENT: Negative for mouth sores, mouth dryness and nose dryness.   Eyes: Positive for redness, itching and dryness.  Respiratory: Negative for cough, hemoptysis, shortness of breath and difficulty breathing.   Cardiovascular: Negative for chest pain, palpitations, hypertension, irregular heartbeat and swelling in legs/feet.  Gastrointestinal: Negative for blood in stool, constipation and diarrhea.  Endocrine: Negative for excessive thirst and increased urination.  Genitourinary: Negative for painful urination.  Musculoskeletal: Negative for arthralgias, joint pain, joint swelling, myalgias, muscle weakness, morning stiffness, muscle tenderness and myalgias.  Skin: Negative for color change, rash, hair loss, nodules/bumps, skin tightness, ulcers and sensitivity to sunlight.  Allergic/Immunologic: Negative for susceptible to infections.  Neurological: Positive for headaches. Negative for fainting, memory loss, night sweats and weakness.  Hematological: Negative for bruising/bleeding tendency and swollen glands.  Psychiatric/Behavioral: Negative for depressed mood and sleep disturbance. The patient is not nervous/anxious.     PMFS History:  Patient Active Problem List   Diagnosis Date Noted  . 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 Seropositive rheumatoid arthritis ([place]) 09/03/2016  . High risk medication use 09/03/2016  . Primary osteoarthritis of both hands 09/03/2016  . Primary osteoarthritis of both feet 09/03/2016  . Flexion contracture of elbow 09/03/2016  . 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  Plantar fasciitis 09/03/2016  . Dyslipidemia 09/03/2016  . Goiter 09/03/2016  . Bilateral hearing loss 09/03/2016  . History of neutropenia 09/03/2016    Past Medical History:  Diagnosis Date  . Hearing difficulty   . Hypercholesteremia   . Rash   . Rheumatoid arthritis ([place])   .  Thyroid disease     Family History  Problem Relation Age of Onset  . Cancer Father        prostate,lung  . Cancer Other   . Seizures Other   . Thyroid disease Other    Past Surgical History:  Procedure Laterality Date  . THYROID LOBECTOMY  10/21/11  . THYROID LOBECTOMY  10/21/2011   Procedure: THYROID LOBECTOMY;  Surgeon: Haywood Lasso, MD;  Location: [facility];  Service: General;  Laterality: Left;  Removal left lobe of thyroid   Social History   Social History Narrative  . Not on file    Objective: Vital Signs: BP 113/69 (BP Location: Left Arm, Patient Position: Sitting, Cuff Size: Normal)   Pulse 79   Resp 16   Ht 6\' 2"  (1.88 m)   Wt 208 lb (94.3 kg)   BMI 26.71 kg/m    Physical Exam Vitals signs and nursing note reviewed.  Constitutional:      Appearance: He is well-developed.  HENT:     Head: Normocephalic and atraumatic.  Eyes:     Conjunctiva/sclera: Conjunctivae normal.     Pupils: Pupils are equal, round, and reactive to light.  Neck:     Musculoskeletal: Normal range of motion and neck supple.  Cardiovascular:     Rate and Rhythm: Normal rate and regular rhythm.     Heart sounds: Normal heart sounds.  Pulmonary:     Effort: Pulmonary effort is normal.     Breath sounds: Normal breath sounds.  Abdominal:     General: Bowel sounds are normal.     Palpations: Abdomen is soft.  Lymphadenopathy:     Cervical: No cervical adenopathy.  Skin:    General: Skin is warm and dry.     Capillary Refill: Capillary refill takes less than 2 seconds.  Neurological:     Mental Status: He is alert and oriented to person, place, and time.  Psychiatric:        Behavior: Behavior normal.      Musculoskeletal Exam: C-spine, thoracic spine, lumbar spine good range of motion.  No midline spinal tenderness.  No SI joint tenderness.  Shoulder is good range of motion with no discomfort.  Mild elbow joint contractures bilaterally.  Wrist joints, MCPs, PIPs, DIPs good range of  motion with no synovitis.  He has mild DIP synovial thickening.  He has complete fist formation bilaterally.  Hip joints, knee joints, ankle joints, MTPs, PIPs, DIPs good range of motion with no synovitis.  No warmth or effusion of bilateral knee joints. Bilateral knee joint crepitus.   No tenderness or swelling of ankle joints.  Morton's neuroma of bilateral feet.  No tenderness over trochanter bursa bilaterally.  CDAI Exam: CDAI Score: 0.2  Patient Global Assessment: 1 (mm); Provider Global Assessment: 1 (mm) Swollen: 0 ; Tender: 0  Joint Exam   Not documented   There is currently no information documented on the homunculus. Go to the Rheumatology activity and complete the homunculus joint exam.  Investigation: No additional findings.  Imaging: No results found.  Recent Labs: Lab Results  Component Value Date   WBC 6.3 04/27/2018   HGB 14.6 04/27/2018  PLT 231 04/27/2018   NA 140 04/27/2018   K 4.9 04/27/2018   CL 103 04/27/2018   CO2 29 04/27/2018   GLUCOSE 92 04/27/2018   BUN 14 04/27/2018   CREATININE 0.86 04/27/2018   BILITOT 0.6 04/27/2018   ALKPHOS 35 (L) 02/04/2017   AST 15 04/27/2018   ALT 21 04/27/2018   PROT 7.1 04/27/2018   ALBUMIN 4.1 02/04/2017   CALCIUM 9.5 04/27/2018   GFRAA 117 04/27/2018    Speciality Comments: PLQ EYE Exam: 08/26/17 WNL @  [place]  Procedures:  No procedures performed Allergies: Patient has no known allergies.   Assessment / Plan:     Visit Diagnoses: Seropositive rheumatoid arthritis ([place]) - +RF +CCP severe disease with synovitis  - US exam in 12/2016 revealed only mild synovitis: He has no synovitis on exam.  He has not had any recent rheumatoid arthritis flares.  He has no tenderness on exam today.  He has no joint pain, joint swelling, or morning stiffness.  He is clinically doing well on methotrexate 8 tablets by mouth once weekly and Plaquenil 200 mg 1 tablet by mouth daily.  He does not need any refills at this time. We  will schedule an ultrasound to assess for synovitis to determine if his disease is active. He was advised to notify us if develops any increased joint pain or joint swelling.  He will follow-up in the office in 5 months.  High risk medication use - Methotrexate 8 tablets po once weekly, folic acid 2 mg by mouth daily and Plaquenil 200 one tablet daily.he denies exam several weeks ago which was normal.  He sees Dr. Ronnald Ramp on a yearly basis.  We will try to obtain the results of the Plaquenil eye exam.  CBC and CMP will be drawn today to monitor for drug toxicity.  He will return in April and every 3 months for lab work.- Plan: CBC with Differential/Platelet, COMPLETE METABOLIC PANEL WITH GFR  Primary osteoarthritis of both hands - Mild PIP and DIP synovial thickening.  He has no synovitis on exam.  He has complete fist formation bilaterally.  Joint protection and muscle strengthening were discussed.  Primary osteoarthritis of both feet: He has mild osteoarthritic changes in bilateral feet.  He has no discomfort in his feet at this time.  He wears proper fitting shoes.  Flexion contracture of left elbow: Chronic.  He has no tenderness or synovitis.  No rheumatoid nodules palpated.  Flexion contracture of right elbow: Chronic.  He has no tenderness or synovitis on exam.  No rheumatoid nodules were palpated.  Chondromalacia of both patellae: He has bilateral knee crepitus.  No warmth or effusion noted.  He has no discomfort in his knee joints at this time.  Morton's neuroma of both feet - He sees a Restaurant manager, fast food on a regular basis.   Frequent headaches: He has been having more frequent headaches recently.  He reports that the headache typically starts mid afternoon.  He has occasional blurry vision.  Denies any nausea or other associated symptoms.  He reports he takes Tylenol if his headache is severe.  He has not been able to identify any triggers.  It does not seem to be correlated with his dose of  methotrexate.  He has no temporal artery tenderness on exam. He requested a referral to neurology for further evaluation.   Eye redness: He experiences eye redness and eye dryness intermittently.  He sees Dr. Ronnald Ramp on a yearly basis.  He  uses over-the-counter eyedrops which relieve his symptoms.  He was advised to notify Dr. Ronnald Ramp if his symptoms worsen.  Other medical conditions are listed as follows:   History of goiter  History of neutropenia  Dyslipidemia  Bilateral hearing loss, unspecified hearing loss type   Orders: Orders Placed This Encounter  Procedures  . CBC with Differential/Platelet  . COMPLETE METABOLIC PANEL WITH GFR   No orders of the defined types were placed in this encounter.     Follow-Up Instructions: Return in about 5 months (around 02/28/2019) for Rheumatoid arthritis, Osteoarthritis.   Ofilia Neas, PA-C   I examined and evaluated the patient with Hazel Sams PA.  Patient had no synovitis on my examination today.  He has some synovial thickening.  He continues to have some discomfort due to underlying osteoarthritis.  Has been tolerating medications well.  We discussed possible taper in future.  The plan of care was discussed as noted above.  Bo Merino, MD  Note - This record has been created using Editor, commissioning.  Chart creation errors have been sought, but may not always  have been located. Such creation errors do not reflect on  the standard of medical care.

## 2018-09-29 ENCOUNTER — Encounter: Payer: Self-pay | Admitting: Rheumatology

## 2018-09-29 ENCOUNTER — Other Ambulatory Visit: Payer: Self-pay | Admitting: Rheumatology

## 2018-09-29 ENCOUNTER — Ambulatory Visit: Payer: BC Managed Care – PPO | Admitting: Rheumatology

## 2018-09-29 ENCOUNTER — Other Ambulatory Visit: Payer: Self-pay

## 2018-09-29 VITALS — BP 113/69 | HR 79 | Resp 16 | Ht 74.0 in | Wt 208.0 lb

## 2018-09-29 DIAGNOSIS — R519 Headache, unspecified: Secondary | ICD-10-CM

## 2018-09-29 DIAGNOSIS — Z8639 Personal history of other endocrine, nutritional and metabolic disease: Secondary | ICD-10-CM

## 2018-09-29 DIAGNOSIS — Z79899 Other long term (current) drug therapy: Secondary | ICD-10-CM | POA: Diagnosis not present

## 2018-09-29 DIAGNOSIS — R51 Headache: Principal | ICD-10-CM

## 2018-09-29 DIAGNOSIS — M19071 Primary osteoarthritis, right ankle and foot: Secondary | ICD-10-CM | POA: Diagnosis not present

## 2018-09-29 DIAGNOSIS — Z862 Personal history of diseases of the blood and blood-forming organs and certain disorders involving the immune mechanism: Secondary | ICD-10-CM

## 2018-09-29 DIAGNOSIS — M24521 Contracture, right elbow: Secondary | ICD-10-CM

## 2018-09-29 DIAGNOSIS — M24522 Contracture, left elbow: Secondary | ICD-10-CM

## 2018-09-29 DIAGNOSIS — M2241 Chondromalacia patellae, right knee: Secondary | ICD-10-CM

## 2018-09-29 DIAGNOSIS — M059 Rheumatoid arthritis with rheumatoid factor, unspecified: Secondary | ICD-10-CM | POA: Diagnosis not present

## 2018-09-29 DIAGNOSIS — M19041 Primary osteoarthritis, right hand: Secondary | ICD-10-CM

## 2018-09-29 DIAGNOSIS — G5763 Lesion of plantar nerve, bilateral lower limbs: Secondary | ICD-10-CM

## 2018-09-29 DIAGNOSIS — M2242 Chondromalacia patellae, left knee: Secondary | ICD-10-CM

## 2018-09-29 DIAGNOSIS — M19072 Primary osteoarthritis, left ankle and foot: Secondary | ICD-10-CM

## 2018-09-29 DIAGNOSIS — M19042 Primary osteoarthritis, left hand: Secondary | ICD-10-CM

## 2018-09-29 DIAGNOSIS — H9193 Unspecified hearing loss, bilateral: Secondary | ICD-10-CM

## 2018-09-29 DIAGNOSIS — H5789 Other specified disorders of eye and adnexa: Secondary | ICD-10-CM

## 2018-09-29 DIAGNOSIS — E785 Hyperlipidemia, unspecified: Secondary | ICD-10-CM

## 2018-09-29 LAB — COMPLETE METABOLIC PANEL WITH GFR
AG Ratio: 1.9 (calc) (ref 1.0–2.5)
ALBUMIN MSPROF: 4.6 g/dL (ref 3.6–5.1)
ALKALINE PHOSPHATASE (APISO): 37 U/L — AB (ref 40–115)
ALT: 13 U/L (ref 9–46)
AST: 12 U/L (ref 10–35)
BILIRUBIN TOTAL: 0.5 mg/dL (ref 0.2–1.2)
BUN: 17 mg/dL (ref 7–25)
CO2: 28 mmol/L (ref 20–32)
Calcium: 9.5 mg/dL (ref 8.6–10.3)
Chloride: 105 mmol/L (ref 98–110)
Creat: 0.87 mg/dL (ref 0.70–1.33)
GFR, Est African American: 116 mL/min/{1.73_m2} (ref >=60)
GFR, Est Non African American: 100 mL/min/{1.73_m2} (ref >=60)
GLOBULIN: 2.4 g/dL (ref 1.9–3.7)
Glucose, Bld: 72 mg/dL (ref 65–99)
Potassium: 4.6 mmol/L (ref 3.5–5.3)
Sodium: 142 mmol/L (ref 135–146)
Total Protein: 7 g/dL (ref 6.1–8.1)

## 2018-09-29 LAB — CBC WITH DIFFERENTIAL/PLATELET
ABSOLUTE MONOCYTES: 538 {cells}/uL (ref 200–950)
BASOS ABS: 62 {cells}/uL (ref 0–200)
Basophils Relative: 0.9 %
EOS ABS: 69 {cells}/uL (ref 15–500)
Eosinophils Relative: 1 %
HEMATOCRIT: 43.4 % (ref 38.5–50.0)
HEMOGLOBIN: 15 g/dL (ref 13.2–17.1)
LYMPHS ABS: 1421 {cells}/uL (ref 850–3900)
MCH: 30.3 pg (ref 27.0–33.0)
MCHC: 34.6 g/dL (ref 32.0–36.0)
MCV: 87.7 fL (ref 80.0–100.0)
MPV: 11.1 fL (ref 7.5–12.5)
Monocytes Relative: 7.8 %
NEUTROS ABS: 4809 {cells}/uL (ref 1500–7800)
Neutrophils Relative %: 69.7 %
Platelets: 251 10*3/uL (ref 140–400)
RBC: 4.95 10*6/uL (ref 4.20–5.80)
RDW: 14.2 % (ref 11.0–15.0)
Total Lymphocyte: 20.6 %
WBC: 6.9 10*3/uL (ref 3.8–10.8)

## 2018-09-29 NOTE — Patient Instructions (Signed)
Standing Labs We placed an order today for your standing lab work.    Please come back and get your standing labs in April and every 3 months  We have open lab Monday through Friday from 8:30-11:30 AM and 1:30-4:00 PM  at the office of Dr. Shaili Deveshwar.   You may experience shorter wait times on Monday and Friday afternoons. The office is located at [address], [address], [place],  [address] No appointment is necessary.   Labs are drawn by [facility].  You may receive a bill from [facility] for your lab work.  If you wish to have your labs drawn at another location, please call the office 24 hours in advance to send orders.  If you have any questions regarding directions or hours of operation,  please call [phone].   Just as a reminder please drink plenty of water prior to coming for your lab work. Thanks!  

## 2018-09-29 NOTE — Telephone Encounter (Signed)
Patient had labs drawn today.  Okay to give him 90-day supply.

## 2018-09-29 NOTE — Telephone Encounter (Signed)
Last Visit: 04/27/18 Next Visit: due January 2020.Message sent to the front to schedule patient.  Labs: 04/27/18 Neg   Left message to advised patient he is due for labs and to schedule a follow up appointment.   Okay to refill 30 day supply MTX?

## 2018-10-02 NOTE — Progress Notes (Signed)
Alk phos is borderline low. We will continue to monitor.  All other labs are WNL.

## 2018-12-21 ENCOUNTER — Telehealth: Payer: Self-pay | Admitting: *Deleted

## 2018-12-21 ENCOUNTER — Other Ambulatory Visit: Payer: Self-pay | Admitting: Rheumatology

## 2018-12-21 NOTE — Telephone Encounter (Signed)
Last Visit: 09/29/18 Next Visit: 02/28/19 Labs: 09/29/18 Alk phos is borderline low. We will continue to monitor. All other labs are WNL.  PLQ EYE Exam: 09/07/18 WNL  Okay to refill per Dr. Deveshwar  

## 2018-12-21 NOTE — Telephone Encounter (Signed)
Received a Prior Authorization request from [facility] for PLQ. Authorization has been submitted to patient's insurance via Cover My Meds. Will update once we receive a response.  Received a fax from [place] regarding a prior authorization for PLQ. Authorization has been APPROVED.

## 2019-02-13 ENCOUNTER — Other Ambulatory Visit: Payer: Self-pay | Admitting: Rheumatology

## 2019-02-13 NOTE — Telephone Encounter (Signed)
Last Visit: 09/29/18 Next Visit: 02/28/19 Labs: 09/29/18 Alk phos is borderline low.All other labs are WNL.   Okay to refill 30 day supply per Dr. Estanislado Pandy

## 2019-02-16 NOTE — Progress Notes (Deleted)
Office Visit Note  Patient: Christian Gentry             Date of Birth: 01/23/1966           MRN: [id number]             PCP: Christain Sacramento, MD Referring: Christain Sacramento, MD Visit Date: 03/02/2019 Occupation: @GUAROCC @  Subjective:  No chief complaint on file.   History of Present Illness: Christian Gentry is a 53 y.o. male ***   Activities of Daily Living:  Patient reports morning stiffness for *** {minute/hour:19697}.   Patient {ACTIONS;DENIES/REPORTS:21021675::"Denies"} nocturnal pain.  Difficulty dressing/grooming: {ACTIONS;DENIES/REPORTS:21021675::"Denies"} Difficulty climbing stairs: {ACTIONS;DENIES/REPORTS:21021675::"Denies"} Difficulty getting out of chair: {ACTIONS;DENIES/REPORTS:21021675::"Denies"} Difficulty using hands for taps, buttons, cutlery, and/or writing: {ACTIONS;DENIES/REPORTS:21021675::"Denies"}  No Rheumatology ROS completed.   PMFS History:  Patient Active Problem List   Diagnosis Date Noted  . 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 Seropositive rheumatoid arthritis ([place]) 09/03/2016  . High risk medication use 09/03/2016  . Primary osteoarthritis of both hands 09/03/2016  . Primary osteoarthritis of both feet 09/03/2016  . Flexion contracture of elbow 09/03/2016  . 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 Plantar fasciitis 09/03/2016  . Dyslipidemia 09/03/2016  . Goiter 09/03/2016  . Bilateral hearing loss 09/03/2016  . History of neutropenia 09/03/2016    Past Medical History:  Diagnosis Date  . Hearing difficulty   . Hypercholesteremia   . Rash   . Rheumatoid arthritis ([place])   . Thyroid disease     Family History  Problem Relation Age of Onset  . Cancer Father        prostate,lung  . Cancer Other   . Seizures Other   . Thyroid disease Other    Past Surgical History:  Procedure Laterality Date  . THYROID LOBECTOMY  10/21/11  . THYROID LOBECTOMY  10/21/2011   Procedure: THYROID LOBECTOMY;  Surgeon: Haywood Lasso, MD;  Location: [facility];  Service: General;  Laterality: Left;  Removal left lobe of thyroid   Social History   Social History Narrative  . Not on file    There is no immunization history on file for this patient.   Objective: Vital Signs: There were no vitals taken for this visit.   Physical Exam   Musculoskeletal Exam: ***  CDAI Exam: CDAI Score: Not documented Patient Global Assessment: Not documented; Provider Global Assessment: Not documented Swollen: Not documented; Tender: Not documented Joint Exam   Not documented   There is currently no information documented on the homunculus. Go to the Rheumatology activity and complete the homunculus joint exam.  Investigation: No additional findings.  Imaging: No results found.  Recent Labs: Lab Results  Component Value Date   WBC 6.9 09/29/2018   HGB 15.0 09/29/2018   PLT 251 09/29/2018   NA 142 09/29/2018   K 4.6 09/29/2018   CL 105 09/29/2018   CO2 28 09/29/2018   GLUCOSE 72 09/29/2018   BUN 17 09/29/2018   CREATININE 0.87 09/29/2018   BILITOT 0.5 09/29/2018   ALKPHOS 35 (L) 02/04/2017   AST 12 09/29/2018   ALT 13 09/29/2018   PROT 7.0 09/29/2018   ALBUMIN 4.1 02/04/2017   CALCIUM 9.5 09/29/2018   GFRAA 116 09/29/2018    Speciality Comments: PLQ EYE Exam: 09/07/18 WNL @  [facility] follow up 1 year  Procedures:  No procedures performed Allergies: Patient has no known allergies.   Assessment / Plan:     Visit Diagnoses:  No diagnosis found.   Orders: No orders of the defined types were placed in this encounter.  No orders of the defined types were placed in this encounter.   Face-to-face time spent with patient was *** minutes. Greater than 50% of time was spent in counseling and coordination of care.  Follow-Up Instructions: No follow-ups on file.   Ofilia Neas, PA-C  Note - This record has been created using Dragon software.  Chart creation errors have been sought, but may not  always  have been located. Such creation errors do not reflect on  the standard of medical care.

## 2019-02-28 ENCOUNTER — Other Ambulatory Visit: Payer: Self-pay | Admitting: Rheumatology

## 2019-03-02 ENCOUNTER — Ambulatory Visit: Payer: Self-pay | Admitting: Rheumatology

## 2019-03-08 ENCOUNTER — Other Ambulatory Visit: Payer: Self-pay | Admitting: Rheumatology

## 2019-03-08 NOTE — Telephone Encounter (Addendum)
Last Visit: 09/29/18 Next Visit due June 2020.   Labs: 09/29/18 Alk phos is borderline low.All other labs are WNL.   Attempted to contact the patient and left message for patient to call the office. Patient due for labs and a follow up visit.

## 2019-03-30 ENCOUNTER — Telehealth: Payer: Self-pay | Admitting: Rheumatology

## 2019-03-30 MED ORDER — METHOTREXATE 2.5 MG PO TABS: ORAL_TABLET | ORAL | 0 refills | Status: DC

## 2019-03-30 NOTE — Telephone Encounter (Signed)
Last Visit: 09/29/2018 Next Visit: 05/03/2019 Labs: 09/29/2018 Alk phos is borderline low. We will continue to monitor. All other labs are WNL.   Advised patient he is due to update labs. Patient verbalized understanding and will have labs drawn next week. Provided patient with lab hours.   Okay to refill 30 day supply, per Dr. Estanislado Pandy.

## 2019-03-30 NOTE — Telephone Encounter (Signed)
Patient called requesting prescription refill Methotrexate to be sent to [facility] in [facility].

## 2019-04-19 NOTE — Progress Notes (Signed)
Office Visit Note  Patient: Christian Gentry             Date of Birth: [birth year]           MRN: [id number]             PCP: Christain Sacramento, MD Referring: Christain Sacramento, MD Visit Date: 05/03/2019 Occupation: @GUAROCC @  Subjective:  Medication monitoring   History of Present Illness: Christian Philopateer Strine is a 53 y.o. male with history of seropositive rheumatoid thrice and osteoarthritis.  Patient is taking methotrexate 8 tablets by mouth once weekly, folic acid 2 mg by mouth daily, Plaquenil 20 mg 1 tablet by mouth daily.  He denies any recent rheumatoid arthritis flares.  He denies any joint pain or joint swelling.  He denies any morning stiffness.  He would like to consider reducing the dose of methotrexate since he has been doing so well clinically.  Activities of Daily Living:  Patient reports morning stiffness for 0  minutes.   Patient Denies nocturnal pain.  Difficulty dressing/grooming: Denies Difficulty climbing stairs: Denies Difficulty getting out of chair: Denies Difficulty using hands for taps, buttons, cutlery, and/or writing: Denies  Review of Systems  Constitutional: Negative for fatigue and night sweats.  HENT: Negative for mouth sores, mouth dryness and nose dryness.   Eyes: Positive for redness and dryness.  Respiratory: Negative for cough, hemoptysis, shortness of breath and difficulty breathing.   Cardiovascular: Negative for chest pain, palpitations, hypertension, irregular heartbeat and swelling in legs/feet.  Gastrointestinal: Negative for blood in stool, constipation and diarrhea.  Endocrine: Negative for increased urination.  Genitourinary: Negative for painful urination.  Musculoskeletal: Negative for arthralgias, joint pain, joint swelling, myalgias, muscle weakness, morning stiffness, muscle tenderness and myalgias.  Skin: Negative for color change, rash, hair loss, nodules/bumps, skin tightness, ulcers and sensitivity to sunlight.   Allergic/Immunologic: Negative for susceptible to infections.  Neurological: Negative for dizziness, fainting, memory loss, night sweats and weakness.  Hematological: Negative for swollen glands.  Psychiatric/Behavioral: Negative for depressed mood and sleep disturbance. The patient is not nervous/anxious.     PMFS History:  Patient Active Problem List   Diagnosis Date Noted  . 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 Seropositive rheumatoid arthritis ([place]) 09/03/2016  . High risk medication use 09/03/2016  . Primary osteoarthritis of both hands 09/03/2016  . Primary osteoarthritis of both feet 09/03/2016  . Flexion contracture of elbow 09/03/2016  . 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 Plantar fasciitis 09/03/2016  . Dyslipidemia 09/03/2016  . Goiter 09/03/2016  . Bilateral hearing loss 09/03/2016  . History of neutropenia 09/03/2016    Past Medical History:  Diagnosis Date  . Hearing difficulty   . Hypercholesteremia   . Rash   . Rheumatoid arthritis ([place])   . Thyroid disease     Family History  Problem Relation Age of Onset  . Cancer Father        prostate,lung  . Cancer Other   . Seizures Other   . Thyroid disease Other    Past Surgical History:  Procedure Laterality Date  . THYROID LOBECTOMY  10/21/11  . THYROID LOBECTOMY  10/21/2011   Procedure: THYROID LOBECTOMY;  Surgeon: Haywood Lasso, MD;  Location: [facility];  Service: General;  Laterality: Left;  Removal left lobe of thyroid   Social History   Social History Narrative  . Not on file    There is no immunization history on file for this patient.  Objective: Vital Signs: BP 121/80 (BP Location: Left Arm, Patient Position: Sitting, Cuff Size: Normal)   Pulse 63   Resp 14   Ht 6\' 2"  (1.88 m)   Wt 204 lb 9 oz (92.8 kg)   BMI 26.26 kg/m    Physical Exam Vitals signs and nursing note reviewed.  Constitutional:      Appearance: He is well-developed.  HENT:     Head: Normocephalic  and atraumatic.  Eyes:     Conjunctiva/sclera: Conjunctivae normal.     Pupils: Pupils are equal, round, and reactive to light.  Neck:     Musculoskeletal: Normal range of motion and neck supple.  Cardiovascular:     Rate and Rhythm: Normal rate and regular rhythm.     Heart sounds: Normal heart sounds.  Pulmonary:     Effort: Pulmonary effort is normal.     Breath sounds: Normal breath sounds.  Abdominal:     General: Bowel sounds are normal.     Palpations: Abdomen is soft.  Skin:    General: Skin is warm and dry.     Capillary Refill: Capillary refill takes less than 2 seconds.  Neurological:     Mental Status: He is alert and oriented to person, place, and time.  Psychiatric:        Behavior: Behavior normal.      Musculoskeletal Exam: C-spine, thoracic spine, lumbar spine good range of motion.  No midline spinal tenderness.  No SI joint tenderness.  Shoulder joints, elbow joints, wrist joints, MCPs, PIPs, DIPs good range of motion no synovitis.  He has complete fist formation bilaterally. Mild PIP and DIP synovial thickening. Hip joints, knee joints, ankle joints, MCPs, PIPs, DIPs good range of motion no synovitis.  No warmth or effusion of bilateral knee joints.  No tenderness or swelling of ankle joints.  CDAI Exam: CDAI Score: 0  Patient Global: 0 mm; Provider Global: 0 mm Swollen: 0 ; Tender: 0  Joint Exam   No joint exam has been documented for this visit   There is currently no information documented on the homunculus. Go to the Rheumatology activity and complete the homunculus joint exam.  Investigation: No additional findings.  Imaging: No results found.  Recent Labs: Lab Results  Component Value Date   WBC 6.0 04/20/2019   HGB 14.1 04/20/2019   PLT 235 04/20/2019   NA 142 04/20/2019   K 4.6 04/20/2019   CL 107 04/20/2019   CO2 26 04/20/2019   GLUCOSE 96 04/20/2019   BUN 17 04/20/2019   CREATININE 0.77 04/20/2019   BILITOT 0.4 04/20/2019   ALKPHOS  35 (L) 02/04/2017   AST 11 04/20/2019   ALT 11 04/20/2019   PROT 6.9 04/20/2019   ALBUMIN 4.1 02/04/2017   CALCIUM 9.5 04/20/2019   GFRAA 122 04/20/2019    Speciality Comments: PLQ EYE Exam: 09/07/18 WNL @  [facility] follow up 1 year  Procedures:  No procedures performed Allergies: Patient has no known allergies.   Assessment / Plan:     Visit Diagnoses: Seropositive rheumatoid arthritis ([place]) - +RF +CCP severe disease with synovitis  - US exam in 12/2016 revealed only mild synovitis: He has no synovitis on exam.  He has not had any recent rheumatoid arthritis flares.  He is clinically doing well on methotrexate 8 tablets by mouth once weekly, folic acid 2 mg by mouth daily, and Plaquenil 200 mg 1 tablet by mouth daily.  He has not missed any doses of  his medications recently.  He has no joint pain or joint swelling at this time.  Has no morning stiffness.  We discussed discontinuing PLQ at this time since he is clinically doing well.  He will continue taking MTX as prescribed.   He was advised to notify us if he develops increased joint pain or joint swelling.  He will follow-up in the office in 5 months.  High risk medication use - Methotrexate 8 tablets po once weekly, folic acid 2 mg by mouth daily and Plaquenil 200 mg one tablet by mouth daily.   Primary osteoarthritis of both hands: Mild PIP and DIP synovial thickening consistent with osteoarthritis of bilateral hands.  He has complete fist formation bilaterally.  He has no tenderness or synovitis.  Joint protection and muscle strengthening were discussed  Primary osteoarthritis of both feet: He has no feet pain at this time.  Flexion contracture of left elbow: Chronic, unchanged.  No tenderness or inflammation.   Flexion contracture of right elbow: Chronic, unchanged.  No tenderness or inflammation.   Chondromalacia of both patella: He has bilateral knee joint crepitus.  No warmth or effusion of knee joints.   Morton's neuroma  of both feet: He has no discomfort at this time.   Other medical conditions are listed as follows:   History of goiter  History of neutropenia  Dyslipidemia  Orders: No orders of the defined types were placed in this encounter.  No orders of the defined types were placed in this encounter.    Follow-Up Instructions: Return in about 5 months (around 10/03/2019) for Rheumatoid arthritis, Osteoarthritis.   Ofilia Neas, PA-C  I examined and evaluated the patient with Hazel Sams PA.  Patient's rheumatoid arthritis is very well controlled.  He had no synovitis on examination.  He has not had a flare in a long time.  We decided to discontinue Plaquenil for right now.  We will monitor how he does without Plaquenil.  In the future the methotrexate dose can be lowered.  The plan of care was discussed as noted above.  Bo Merino, MD Note - This record has been created using Editor, commissioning.  Chart creation errors have been sought, but may not always  have been located. Such creation errors do not reflect on  the standard of medical care.

## 2019-04-20 ENCOUNTER — Other Ambulatory Visit: Payer: Self-pay

## 2019-04-20 DIAGNOSIS — Z79899 Other long term (current) drug therapy: Secondary | ICD-10-CM

## 2019-04-21 ENCOUNTER — Other Ambulatory Visit: Payer: Self-pay | Admitting: Rheumatology

## 2019-04-21 LAB — COMPLETE METABOLIC PANEL WITH GFR
AG Ratio: 2.1 (calc) (ref 1.0–2.5)
ALT: 11 U/L (ref 9–46)
AST: 11 U/L (ref 10–35)
Albumin: 4.7 g/dL (ref 3.6–5.1)
Alkaline phosphatase (APISO): 38 U/L (ref 35–144)
BUN: 17 mg/dL (ref 7–25)
CO2: 26 mmol/L (ref 20–32)
Calcium: 9.5 mg/dL (ref 8.6–10.3)
Chloride: 107 mmol/L (ref 98–110)
Creat: 0.77 mg/dL (ref 0.70–1.33)
GFR, Est African American: 122 mL/min/{1.73_m2} (ref >=60)
GFR, Est Non African American: 105 mL/min/{1.73_m2} (ref >=60)
Globulin: 2.2 g/dL (calc) (ref 1.9–3.7)
Glucose, Bld: 96 mg/dL (ref 65–99)
Potassium: 4.6 mmol/L (ref 3.5–5.3)
Sodium: 142 mmol/L (ref 135–146)
Total Bilirubin: 0.4 mg/dL (ref 0.2–1.2)
Total Protein: 6.9 g/dL (ref 6.1–8.1)

## 2019-04-21 LAB — CBC WITH DIFFERENTIAL/PLATELET
Absolute Monocytes: 456 cells/uL (ref 200–950)
Basophils Absolute: 48 cells/uL (ref 0–200)
Basophils Relative: 0.8 %
Eosinophils Absolute: 78 cells/uL (ref 15–500)
Eosinophils Relative: 1.3 %
HCT: 41.3 % (ref 38.5–50.0)
Hemoglobin: 14.1 g/dL (ref 13.2–17.1)
Lymphs Abs: 1542 cells/uL (ref 850–3900)
MCH: 30 pg (ref 27.0–33.0)
MCHC: 34.1 g/dL (ref 32.0–36.0)
MCV: 87.9 fL (ref 80.0–100.0)
MPV: 10.9 fL (ref 7.5–12.5)
Monocytes Relative: 7.6 %
Neutro Abs: 3876 cells/uL (ref 1500–7800)
Neutrophils Relative %: 64.6 %
Platelets: 235 10*3/uL (ref 140–400)
RBC: 4.7 10*6/uL (ref 4.20–5.80)
RDW: 13.5 % (ref 11.0–15.0)
Total Lymphocyte: 25.7 %
WBC: 6 10*3/uL (ref 3.8–10.8)

## 2019-04-23 NOTE — Telephone Encounter (Signed)
Last Visit: 09/29/18 Next Visit: 05/03/19 Labs: 04/20/19 WNL  Okay to refill per Dr. Estanislado Pandy

## 2019-05-03 ENCOUNTER — Encounter: Payer: Self-pay | Admitting: Rheumatology

## 2019-05-03 ENCOUNTER — Ambulatory Visit: Payer: BC Managed Care – PPO | Admitting: Rheumatology

## 2019-05-03 ENCOUNTER — Other Ambulatory Visit: Payer: Self-pay

## 2019-05-03 VITALS — BP 121/80 | HR 63 | Resp 14 | Ht 74.0 in | Wt 204.6 lb

## 2019-05-03 DIAGNOSIS — M19041 Primary osteoarthritis, right hand: Secondary | ICD-10-CM

## 2019-05-03 DIAGNOSIS — M24522 Contracture, left elbow: Secondary | ICD-10-CM

## 2019-05-03 DIAGNOSIS — M19042 Primary osteoarthritis, left hand: Secondary | ICD-10-CM

## 2019-05-03 DIAGNOSIS — Z79899 Other long term (current) drug therapy: Secondary | ICD-10-CM | POA: Diagnosis not present

## 2019-05-03 DIAGNOSIS — Z862 Personal history of diseases of the blood and blood-forming organs and certain disorders involving the immune mechanism: Secondary | ICD-10-CM

## 2019-05-03 DIAGNOSIS — E785 Hyperlipidemia, unspecified: Secondary | ICD-10-CM

## 2019-05-03 DIAGNOSIS — M2241 Chondromalacia patellae, right knee: Secondary | ICD-10-CM

## 2019-05-03 DIAGNOSIS — Z8639 Personal history of other endocrine, nutritional and metabolic disease: Secondary | ICD-10-CM

## 2019-05-03 DIAGNOSIS — M19071 Primary osteoarthritis, right ankle and foot: Secondary | ICD-10-CM

## 2019-05-03 DIAGNOSIS — M19072 Primary osteoarthritis, left ankle and foot: Secondary | ICD-10-CM

## 2019-05-03 DIAGNOSIS — G5763 Lesion of plantar nerve, bilateral lower limbs: Secondary | ICD-10-CM

## 2019-05-03 DIAGNOSIS — M059 Rheumatoid arthritis with rheumatoid factor, unspecified: Secondary | ICD-10-CM

## 2019-05-03 DIAGNOSIS — M24521 Contracture, right elbow: Secondary | ICD-10-CM

## 2019-05-03 DIAGNOSIS — M2242 Chondromalacia patellae, left knee: Secondary | ICD-10-CM

## 2019-05-03 NOTE — Patient Instructions (Signed)
Standing Labs We placed an order today for your standing lab work.    Please come back and get your standing labs in November and every 3 months   We have open lab daily Monday through Thursday from 8:30-12:30 PM and 1:30-4:30 PM and Friday from 8:30-12:30 PM and 1:30 -4:00 PM at the office of Dr. Shaili Deveshwar.   You may experience shorter wait times on Monday and Friday afternoons. The office is located at [address], [address], [place], [place] [address] No appointment is necessary.   Labs are drawn by [facility].  You may receive a bill from [facility] for your lab work.  If you wish to have your labs drawn at another location, please call the office 24 hours in advance to send orders.  If you have any questions regarding directions or hours of operation,  please call [phone].   Just as a reminder please drink plenty of water prior to coming for your lab work. Thanks 

## 2019-06-27 ENCOUNTER — Other Ambulatory Visit: Payer: Self-pay | Admitting: Rheumatology

## 2019-07-25 ENCOUNTER — Other Ambulatory Visit: Payer: Self-pay | Admitting: Rheumatology

## 2019-07-26 NOTE — Telephone Encounter (Addendum)
Last Visit: 05/03/19 Next Visit: 10/04/19 Labs: 04/20/19 CBC and CMP WNL  Left message to advise patient he is due to update labs.   Okay to refill 30 day supply per Dr. Estanislado Pandy

## 2019-08-22 ENCOUNTER — Other Ambulatory Visit: Payer: Self-pay | Admitting: Rheumatology

## 2019-08-23 ENCOUNTER — Other Ambulatory Visit: Payer: Self-pay | Admitting: *Deleted

## 2019-08-23 DIAGNOSIS — Z79899 Other long term (current) drug therapy: Secondary | ICD-10-CM

## 2019-08-23 NOTE — Telephone Encounter (Signed)
Last Visit: 05/03/2019 Next Visit: 10/04/2019 Labs: 04/20/2019 CBC and CMP WNL  Attempted to contact patient and left message on machine to advise patient he is due to update labs (at a [facility] or [facility]). Advised patient to call the office prior to going so orders can be released.   Okay to refill 30 day supply of MTX?

## 2019-08-23 NOTE — Telephone Encounter (Signed)
ok 

## 2019-08-28 ENCOUNTER — Other Ambulatory Visit: Payer: Self-pay

## 2019-08-28 ENCOUNTER — Telehealth: Payer: Self-pay | Admitting: Rheumatology

## 2019-08-28 DIAGNOSIS — Z79899 Other long term (current) drug therapy: Secondary | ICD-10-CM

## 2019-08-28 NOTE — Telephone Encounter (Signed)
Patient called requesting his labwork orders be sent to [facility] on [address].  Patient will be going tomorrow afternoon 08/29/19.

## 2019-08-28 NOTE — Telephone Encounter (Signed)
Labs released for [facility].

## 2019-09-05 LAB — CBC WITH DIFFERENTIAL/PLATELET
Absolute Monocytes: 477 cells/uL (ref 200–950)
Basophils Absolute: 62 cells/uL (ref 0–200)
Basophils Relative: 1 %
Eosinophils Absolute: 68 cells/uL (ref 15–500)
Eosinophils Relative: 1.1 %
HCT: 40.4 % (ref 38.5–50.0)
Hemoglobin: 13.7 g/dL (ref 13.2–17.1)
Lymphs Abs: 1259 cells/uL (ref 850–3900)
MCH: 30 pg (ref 27.0–33.0)
MCHC: 33.9 g/dL (ref 32.0–36.0)
MCV: 88.6 fL (ref 80.0–100.0)
MPV: 11.1 fL (ref 7.5–12.5)
Monocytes Relative: 7.7 %
Neutro Abs: 4334 cells/uL (ref 1500–7800)
Neutrophils Relative %: 69.9 %
Platelets: 218 10*3/uL (ref 140–400)
RBC: 4.56 10*6/uL (ref 4.20–5.80)
RDW: 13.5 % (ref 11.0–15.0)
Total Lymphocyte: 20.3 %
WBC: 6.2 10*3/uL (ref 3.8–10.8)

## 2019-09-05 LAB — COMPLETE METABOLIC PANEL WITH GFR
AG Ratio: 2.3 (calc) (ref 1.0–2.5)
ALT: 26 U/L (ref 9–46)
AST: 20 U/L (ref 10–35)
Albumin: 4.5 g/dL (ref 3.6–5.1)
Alkaline phosphatase (APISO): 37 U/L (ref 35–144)
BUN: 14 mg/dL (ref 7–25)
CO2: 26 mmol/L (ref 20–32)
Calcium: 9.1 mg/dL (ref 8.6–10.3)
Chloride: 108 mmol/L (ref 98–110)
Creat: 0.84 mg/dL (ref 0.70–1.33)
GFR, Est African American: 117 mL/min/{1.73_m2} (ref >=60)
GFR, Est Non African American: 101 mL/min/{1.73_m2} (ref >=60)
Globulin: 2 g/dL (calc) (ref 1.9–3.7)
Glucose, Bld: 92 mg/dL (ref 65–139)
Potassium: 4.4 mmol/L (ref 3.5–5.3)
Sodium: 145 mmol/L (ref 135–146)
Total Bilirubin: 0.4 mg/dL (ref 0.2–1.2)
Total Protein: 6.5 g/dL (ref 6.1–8.1)

## 2019-09-22 ENCOUNTER — Other Ambulatory Visit: Payer: Self-pay | Admitting: Rheumatology

## 2019-09-24 NOTE — Telephone Encounter (Signed)
Last Visit: 05/03/2019 Next Visit: 10/04/2019 Labs: 09/04/2019 CBC and CMP WNL  Okay to refill per Dr. Estanislado Pandy.

## 2019-10-04 ENCOUNTER — Ambulatory Visit: Payer: BC Managed Care – PPO | Admitting: Rheumatology

## 2019-11-21 ENCOUNTER — Telehealth: Payer: Self-pay | Admitting: Rheumatology

## 2019-11-21 NOTE — Telephone Encounter (Signed)
Patient left a voicemail stating his vision has gotten a lot worse lately and at times is blurry for short periods of time.  Patient states yesterday he was driving home and had double vision which made it difficult to drive.  Patient is asking if it could be related to the Methotrexate that he is taking and if Dr. Estanislado Pandy had any advice.  Please advise.

## 2019-11-21 NOTE — Telephone Encounter (Signed)
Returned patient call.  No answer.  Left voicemail requesting return call.  Methotrexate very rarely causes any ocular toxicity and taking folic acid decreases the risk further.  Very doubtful methotrexate is contributing to his vision issues.  Patient's with rheumatoid arthritis can develop eye involvement.  Recommend patient schedule an appointment with ophthalmologist if his symptoms continue.  Another consideration is that spring can exacerbate allergies causing itchy watery eyes.   Mariella Saa, PharmD, [place], [place] Clinical Specialty Pharmacist [phone]  11/21/2019 4:14 PM

## 2019-11-23 NOTE — Telephone Encounter (Signed)
Patient returned call.  Patient states that he has been having vision issues that have been increasing over the past few months.  He states he is having more blurry vision and it is difficult to drive at night.  He does wear glasses.  Advised that methotrexate very rarely causes any eye issues or ocular toxicity and taking folic acid further reduces the risk.  Very doubtful methotrexate is contributing to his vision issues.  Advised that sometimes patients with rheumatoid arthritis can develop eye involvement.  Recommend patient schedule an appointment with ophthalmologist.  Patient states that he already scheduled an appointment with his eye doctor for next week.  Instructed patient to continue methotrexate at this time and to notify the office of the results from his eye appointment next week.  Advised that if nothing is found after his eye exam and he continues to have vision issues we can discuss discontinuing methotrexate and alternative treatment options.  Patient verbalized understanding.   Mariella Saa, PharmD, [place], [place] Clinical Specialty Pharmacist [phone]  11/23/2019 8:51 AM

## 2019-12-11 ENCOUNTER — Other Ambulatory Visit: Payer: Self-pay | Admitting: Rheumatology

## 2019-12-11 NOTE — Telephone Encounter (Signed)
Last Visit: 05/03/19 Next Visit: due January 2021.  Labs: 09/04/19 WNL  LEft message to advise patient he id due for a follow up visit and to update labs.  Okay to refill 30 day supply MTX?

## 2019-12-11 NOTE — Telephone Encounter (Signed)
Ok to refill 30 day supply of MTX

## 2020-01-11 NOTE — Progress Notes (Signed)
Office Visit Note  Patient: Christian Gentry             Date of Birth: 05/15/1966           MRN: [id number]             PCP: Christain Sacramento, MD Referring: Christain Sacramento, MD Visit Date: 01/17/2020 Occupation: @GUAROCC @  Subjective:  Frequent headaches   History of Present Illness: Christian Loc Wrobleski is a 54 y.o. male with history of seropositive rheumatoid arthritis and osteoarthritis.  Patient is taking methotrexate 8 tablets by mouth once weekly and folic acid 2 mg by mouth daily.  He denies any recent rheumatoid arthritis flares.  He denies any joint pain or joint swelling at this time.  He has not had any morning stiffness or nocturnal pain.  He has not noticed any increased joint pain or joint swelling since discontinuing Plaquenil in July of 10/16/2018. Patient reports that he has been experiencing an increased frequency of headaches for the past 6 months.  He is having at least one headache per week.  He is also had episodic blurry vision and one episode of double vision.  He was evaluated by Dr. Ronnald Ramp his ophthalmologist but according to the patient it was not related to his vision.  He has not seen a neurologist yet.  Activities of Daily Living:  Patient reports morning stiffness for 0 none.   Patient Denies nocturnal pain.  Difficulty dressing/grooming: Denies Difficulty climbing stairs: Denies Difficulty getting out of chair: Denies Difficulty using hands for taps, buttons, cutlery, and/or writing: Denies  Review of Systems  Constitutional: Negative for fatigue and night sweats.  HENT: Negative for mouth sores, mouth dryness and nose dryness.   Eyes: Positive for double vision, redness, visual disturbance and dryness.  Respiratory: Negative for shortness of breath and difficulty breathing.   Cardiovascular: Negative for palpitations, hypertension, irregular heartbeat and swelling in legs/feet.  Gastrointestinal: Negative for constipation and diarrhea.  Endocrine: Negative  for excessive thirst and increased urination.  Genitourinary: Negative for painful urination.  Musculoskeletal: Negative for arthralgias, joint pain, joint swelling, myalgias, muscle weakness, morning stiffness, muscle tenderness and myalgias.  Skin: Negative for color change, rash, hair loss, nodules/bumps, skin tightness, ulcers and sensitivity to sunlight.  Allergic/Immunologic: Negative for susceptible to infections.  Neurological: Positive for headaches. Negative for dizziness, fainting, memory loss, night sweats and weakness.  Hematological: Negative for bruising/bleeding tendency and swollen glands.  Psychiatric/Behavioral: Negative for depressed mood and sleep disturbance. The patient is not nervous/anxious.     PMFS History:  Patient Active Problem List   Diagnosis Date Noted  . 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 Seropositive rheumatoid arthritis ([place]) 09/03/2016  . High risk medication use 09/03/2016  . Primary osteoarthritis of both hands 09/03/2016  . Primary osteoarthritis of both feet 09/03/2016  . Flexion contracture of elbow 09/03/2016  . 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 Plantar fasciitis 09/03/2016  . Dyslipidemia 09/03/2016  . Goiter 09/03/2016  . Bilateral hearing loss 09/03/2016  . History of neutropenia 09/03/2016    Past Medical History:  Diagnosis Date  . Hearing difficulty   . Hypercholesteremia   . Rash   . Rheumatoid arthritis ([place])   . Thyroid disease     Family History  Problem Relation Age of Onset  . Cancer Father        prostate,lung  . Cancer Other   . Seizures Other   . Thyroid disease Other  Past Surgical History:  Procedure Laterality Date  . THYROID LOBECTOMY  10/21/11  . THYROID LOBECTOMY  10/21/2011   Procedure: THYROID LOBECTOMY;  Surgeon: Haywood Lasso, MD;  Location: [facility];  Service: General;  Laterality: Left;  Removal left lobe of thyroid   Social History   Social History Narrative  . Not on  file    There is no immunization history on file for this patient.   Objective: Vital Signs: BP 112/72 (BP Location: Left Arm, Patient Position: Sitting, Cuff Size: Normal)   Pulse 80   Resp 14   Ht 6\' 2"  (1.88 m)   Wt 218 lb 6.4 oz (99.1 kg)   BMI 28.04 kg/m    Physical Exam Vitals and nursing note reviewed.  Constitutional:      Appearance: He is well-developed.  HENT:     Head: Normocephalic and atraumatic.  Eyes:     Conjunctiva/sclera: Conjunctivae normal.     Pupils: Pupils are equal, round, and reactive to light.     Comments: Pterygium noted bilaterally.   Pulmonary:     Effort: Pulmonary effort is normal.  Abdominal:     General: Bowel sounds are normal.     Palpations: Abdomen is soft.  Musculoskeletal:     Cervical back: Normal range of motion and neck supple.  Skin:    General: Skin is warm and dry.     Capillary Refill: Capillary refill takes less than 2 seconds.  Neurological:     Mental Status: He is alert and oriented to person, place, and time.  Psychiatric:        Behavior: Behavior normal.      Musculoskeletal Exam: C-spine, thoracic spine, lumbar spine good range of motion.  Shoulder joints, elbow joints, wrist joints, MCPs, PIPs and DIPs good range of motion no synovitis.  He has complete fist formation bilaterally.  He has DIP prominence noted bilaterally.  Hip joints have good range of motion with no discomfort.  Knee joints have good range of motion no warmth or effusion.  Ankle joints have good range of motion no tenderness or inflammation  CDAI Exam: CDAI Score: -- Patient Global: --; Provider Global: -- Swollen: --; Tender: -- Joint Exam 01/17/2020   No joint exam has been documented for this visit   There is currently no information documented on the homunculus. Go to the Rheumatology activity and complete the homunculus joint exam.  Investigation: No additional findings.  Imaging: No results found.  Recent Labs: Lab Results   Component Value Date   WBC 6.2 09/04/2019   HGB 13.7 09/04/2019   PLT 218 09/04/2019   NA 145 09/04/2019   K 4.4 09/04/2019   CL 108 09/04/2019   CO2 26 09/04/2019   GLUCOSE 92 09/04/2019   BUN 14 09/04/2019   CREATININE 0.84 09/04/2019   BILITOT 0.4 09/04/2019   ALKPHOS 35 (L) 02/04/2017   AST 20 09/04/2019   ALT 26 09/04/2019   PROT 6.5 09/04/2019   ALBUMIN 4.1 02/04/2017   CALCIUM 9.1 09/04/2019   GFRAA 117 09/04/2019    Speciality Comments: PLQ EYE Exam: 09/07/18 WNL @  [facility] follow up 1 year  Procedures:  No procedures performed Allergies: Patient has no known allergies.   Assessment / Plan:     Visit Diagnoses: Seropositive rheumatoid arthritis ([place]) - +RF +CCP severe disease with synovitis  - US exam in 12/2016 revealed only mild synovitis: He has no synovitis or tenderness on exam.  He has  not had any recent rheumatoid arthritis flares.  He is clinically doing well on methotrexate 8 tablets by mouth once weekly and folic acid 2 mg by mouth daily.  He is not experiencing any joint pain or inflammation at this time.  He has no morning stiffness or nocturnal pain.  He has not had any difficulty with ADLs.  He will continue taking methotrexate 8 tablets by mouth once weekly and folic acid 2 mg by mouth daily.  He does not need any refills at this time.  He was advised to notify us if he develops increased joint pain or joint swelling.  He will follow-up in the office in 5 months.  High risk medication use - Methotrexate 8 tablets po once weekly and folic acid 2 mg by mouth daily.  CBC and CMP were within normal limits on 09/04/2019.  He is due to update lab work today.  Orders were released.  He will be due to update lab work in August and every 3 months to monitor for drug toxicity.  Standing orders are in place.- Plan: COMPLETE METABOLIC PANEL WITH GFR, CBC with Differential/Platelet  Primary osteoarthritis of both hands: He has DIP prominence and PIP thickening  bilaterally.  No tenderness or synovitis noted.  He has complete fist formation bilaterally.  Joint protection and muscle strengthening were discussed.   Primary osteoarthritis of both feet: He is not having any discomfort in his feet at this time.  He wears proper fitting shoes.   Flexion contracture of left elbow: Chronic, unchanged.   Flexion contracture of right elbow: Chronic, unchanged.  Chondromalacia of both patellae: He has good ROM of both knee joints.  No warmth or effusion of knee joints.   Morton's neuroma of both feet: He experiences intermittent discomfort.  He has had injections in the past which provided significant relief but his symptoms have returned.  He was evaluated by a podiatrist in the past.  We discussed the importance of wearing proper fitting shoes.  Frequent headaches: He has been experiencing more frequent headaches for the past 6 months.  He has headaches at least once a week.  He also has been experiencing episodic blurry vision and one episode of double vision.  A referral to neurology was placed today.  Blurry vision: He has been experiencing episodic blurry vision and has had one episode of double vision over the past several months.  He was evaluated by his ophthalmologist and according to the patient his symptoms are not related to worsening vision. A referral to neurology was placed today  Other medical conditions are listed as follows:  History of goiter  History of neutropenia  Dyslipidemia   Orders: Orders Placed This Encounter  Procedures  . COMPLETE METABOLIC PANEL WITH GFR  . CBC with Differential/Platelet  . Ambulatory referral to Neurology   No orders of the defined types were placed in this encounter.    Follow-Up Instructions: Return in about 5 months (around 06/18/2020) for Rheumatoid arthritis.   Ofilia Neas, PA-C  Note - This record has been created using Dragon software.  Chart creation errors have been sought, but may  not always  have been located. Such creation errors do not reflect on  the standard of medical care.

## 2020-01-17 ENCOUNTER — Encounter: Payer: Self-pay | Admitting: Physician Assistant

## 2020-01-17 ENCOUNTER — Other Ambulatory Visit: Payer: Self-pay

## 2020-01-17 ENCOUNTER — Ambulatory Visit: Payer: BC Managed Care – PPO | Admitting: Physician Assistant

## 2020-01-17 VITALS — BP 112/72 | HR 80 | Resp 14 | Ht 74.0 in | Wt 218.4 lb

## 2020-01-17 DIAGNOSIS — Z79899 Other long term (current) drug therapy: Secondary | ICD-10-CM

## 2020-01-17 DIAGNOSIS — Z8639 Personal history of other endocrine, nutritional and metabolic disease: Secondary | ICD-10-CM

## 2020-01-17 DIAGNOSIS — M19071 Primary osteoarthritis, right ankle and foot: Secondary | ICD-10-CM

## 2020-01-17 DIAGNOSIS — M19041 Primary osteoarthritis, right hand: Secondary | ICD-10-CM

## 2020-01-17 DIAGNOSIS — H538 Other visual disturbances: Secondary | ICD-10-CM

## 2020-01-17 DIAGNOSIS — M059 Rheumatoid arthritis with rheumatoid factor, unspecified: Secondary | ICD-10-CM | POA: Diagnosis not present

## 2020-01-17 DIAGNOSIS — M24521 Contracture, right elbow: Secondary | ICD-10-CM

## 2020-01-17 DIAGNOSIS — M2242 Chondromalacia patellae, left knee: Secondary | ICD-10-CM

## 2020-01-17 DIAGNOSIS — M19042 Primary osteoarthritis, left hand: Secondary | ICD-10-CM

## 2020-01-17 DIAGNOSIS — R519 Headache, unspecified: Secondary | ICD-10-CM

## 2020-01-17 DIAGNOSIS — M19072 Primary osteoarthritis, left ankle and foot: Secondary | ICD-10-CM

## 2020-01-17 DIAGNOSIS — M2241 Chondromalacia patellae, right knee: Secondary | ICD-10-CM

## 2020-01-17 DIAGNOSIS — E785 Hyperlipidemia, unspecified: Secondary | ICD-10-CM

## 2020-01-17 DIAGNOSIS — M24522 Contracture, left elbow: Secondary | ICD-10-CM

## 2020-01-17 DIAGNOSIS — G5763 Lesion of plantar nerve, bilateral lower limbs: Secondary | ICD-10-CM

## 2020-01-17 DIAGNOSIS — Z862 Personal history of diseases of the blood and blood-forming organs and certain disorders involving the immune mechanism: Secondary | ICD-10-CM

## 2020-01-17 LAB — CBC WITH DIFFERENTIAL/PLATELET
Absolute Monocytes: 515 cells/uL (ref 200–950)
Basophils Absolute: 68 cells/uL (ref 0–200)
Basophils Relative: 1.1 %
Eosinophils Absolute: 130 cells/uL (ref 15–500)
Eosinophils Relative: 2.1 %
HCT: 42.1 % (ref 38.5–50.0)
Hemoglobin: 14 g/dL (ref 13.2–17.1)
Lymphs Abs: 1451 cells/uL (ref 850–3900)
MCH: 29.5 pg (ref 27.0–33.0)
MCHC: 33.3 g/dL (ref 32.0–36.0)
MCV: 88.6 fL (ref 80.0–100.0)
MPV: 10.4 fL (ref 7.5–12.5)
Monocytes Relative: 8.3 %
Neutro Abs: 4036 cells/uL (ref 1500–7800)
Neutrophils Relative %: 65.1 %
Platelets: 233 10*3/uL (ref 140–400)
RBC: 4.75 10*6/uL (ref 4.20–5.80)
RDW: 14 % (ref 11.0–15.0)
Total Lymphocyte: 23.4 %
WBC: 6.2 10*3/uL (ref 3.8–10.8)

## 2020-01-17 LAB — COMPLETE METABOLIC PANEL WITH GFR
AG Ratio: 2 (calc) (ref 1.0–2.5)
ALT: 40 U/L (ref 9–46)
AST: 23 U/L (ref 10–35)
Albumin: 4.5 g/dL (ref 3.6–5.1)
Alkaline phosphatase (APISO): 40 U/L (ref 35–144)
BUN: 13 mg/dL (ref 7–25)
CO2: 27 mmol/L (ref 20–32)
Calcium: 9.2 mg/dL (ref 8.6–10.3)
Chloride: 106 mmol/L (ref 98–110)
Creat: 0.91 mg/dL (ref 0.70–1.33)
GFR, Est African American: 112 mL/min/{1.73_m2} (ref >=60)
GFR, Est Non African American: 97 mL/min/{1.73_m2} (ref >=60)
Globulin: 2.2 g/dL (calc) (ref 1.9–3.7)
Glucose, Bld: 103 mg/dL — ABNORMAL HIGH (ref 65–99)
Potassium: 4 mmol/L (ref 3.5–5.3)
Sodium: 142 mmol/L (ref 135–146)
Total Bilirubin: 0.4 mg/dL (ref 0.2–1.2)
Total Protein: 6.7 g/dL (ref 6.1–8.1)

## 2020-01-17 NOTE — Patient Instructions (Signed)
Standing Labs We placed an order today for your standing lab work.    Please come back and get your standing labs in August and every 3 months   We have open lab daily Monday through Thursday from 8:30-12:30 PM and 1:30-4:30 PM and Friday from 8:30-12:30 PM and 1:30-4:00 PM at the office of Dr. Shaili Deveshwar.   You may experience shorter wait times on Monday and Friday afternoons. The office is located at [address], [address], [place], [place] [address] No appointment is necessary.   Labs are drawn by [facility].  You may receive a bill from [facility] for your lab work.  If you wish to have your labs drawn at another location, please call the office 24 hours in advance to send orders.  If you have any questions regarding directions or hours of operation,  please call [phone].   Just as a reminder please drink plenty of water prior to coming for your lab work. Thanks!   

## 2020-01-18 NOTE — Progress Notes (Signed)
CBC and CMP WNL

## 2020-01-27 ENCOUNTER — Other Ambulatory Visit: Payer: Self-pay | Admitting: Rheumatology

## 2020-01-28 NOTE — Telephone Encounter (Signed)
Last Visit: 01/17/2020 Next Visit: 06/19/2020 Labs: 01/17/2020 WNL  Current Dose per office note 01/17/2020:  methotrexate 8 tablets by mouth once weekly  Okay to refill per Dr. Estanislado Pandy

## 2020-04-19 ENCOUNTER — Other Ambulatory Visit: Payer: Self-pay | Admitting: Rheumatology

## 2020-04-19 NOTE — Telephone Encounter (Signed)
Labs are due now.

## 2020-04-21 NOTE — Telephone Encounter (Signed)
Left message to advise patient he is due for labs.

## 2020-04-22 ENCOUNTER — Other Ambulatory Visit: Payer: Self-pay

## 2020-04-22 DIAGNOSIS — Z79899 Other long term (current) drug therapy: Secondary | ICD-10-CM

## 2020-04-23 LAB — CBC WITH DIFFERENTIAL/PLATELET
Absolute Monocytes: 842 cells/uL (ref 200–950)
Basophils Absolute: 47 cells/uL (ref 0–200)
Basophils Relative: 0.6 %
Eosinophils Absolute: 133 cells/uL (ref 15–500)
Eosinophils Relative: 1.7 %
HCT: 40.7 % (ref 38.5–50.0)
Hemoglobin: 13.8 g/dL (ref 13.2–17.1)
Lymphs Abs: 1162 cells/uL (ref 850–3900)
MCH: 30.2 pg (ref 27.0–33.0)
MCHC: 33.9 g/dL (ref 32.0–36.0)
MCV: 89.1 fL (ref 80.0–100.0)
MPV: 10.7 fL (ref 7.5–12.5)
Monocytes Relative: 10.8 %
Neutro Abs: 5616 cells/uL (ref 1500–7800)
Neutrophils Relative %: 72 %
Platelets: 226 10*3/uL (ref 140–400)
RBC: 4.57 10*6/uL (ref 4.20–5.80)
RDW: 14 % (ref 11.0–15.0)
Total Lymphocyte: 14.9 %
WBC: 7.8 10*3/uL (ref 3.8–10.8)

## 2020-04-23 LAB — COMPLETE METABOLIC PANEL WITH GFR
AG Ratio: 2.1 (calc) (ref 1.0–2.5)
ALT: 20 U/L (ref 9–46)
AST: 13 U/L (ref 10–35)
Albumin: 4.4 g/dL (ref 3.6–5.1)
Alkaline phosphatase (APISO): 44 U/L (ref 35–144)
BUN: 15 mg/dL (ref 7–25)
CO2: 26 mmol/L (ref 20–32)
Calcium: 8.7 mg/dL (ref 8.6–10.3)
Chloride: 107 mmol/L (ref 98–110)
Creat: 1.04 mg/dL (ref 0.70–1.33)
GFR, Est African American: 95 mL/min/{1.73_m2} (ref >=60)
GFR, Est Non African American: 82 mL/min/{1.73_m2} (ref >=60)
Globulin: 2.1 g/dL (calc) (ref 1.9–3.7)
Glucose, Bld: 105 mg/dL — ABNORMAL HIGH (ref 65–99)
Potassium: 4.1 mmol/L (ref 3.5–5.3)
Sodium: 142 mmol/L (ref 135–146)
Total Bilirubin: 0.4 mg/dL (ref 0.2–1.2)
Total Protein: 6.5 g/dL (ref 6.1–8.1)

## 2020-04-23 NOTE — Progress Notes (Signed)
CBC and CMP are normal.

## 2020-06-05 NOTE — Progress Notes (Deleted)
Office Visit Note  Patient: Christian Gentry             Date of Birth: 1965-09-24           MRN: [id number]             PCP: Christain Sacramento, MD Referring: Christain Sacramento, MD Visit Date: 06/19/2020 Occupation: @GUAROCC @  Subjective:  No chief complaint on file.   History of Present Illness: Christian Gentry is a 54 y.o. male ***   Activities of Daily Living:  Patient reports morning stiffness for *** {minute/hour:19697}.   Patient {ACTIONS;DENIES/REPORTS:21021675::"Denies"} nocturnal pain.  Difficulty dressing/grooming: {ACTIONS;DENIES/REPORTS:21021675::"Denies"} Difficulty climbing stairs: {ACTIONS;DENIES/REPORTS:21021675::"Denies"} Difficulty getting out of chair: {ACTIONS;DENIES/REPORTS:21021675::"Denies"} Difficulty using hands for taps, buttons, cutlery, and/or writing: {ACTIONS;DENIES/REPORTS:21021675::"Denies"}  No Rheumatology ROS completed.   PMFS History:  Patient Active Problem List   Diagnosis Date Noted  . 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 Seropositive rheumatoid arthritis ([place]) 09/03/2016  . High risk medication use 09/03/2016  . Primary osteoarthritis of both hands 09/03/2016  . Primary osteoarthritis of both feet 09/03/2016  . Flexion contracture of elbow 09/03/2016  . 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 Plantar fasciitis 09/03/2016  . Dyslipidemia 09/03/2016  . Goiter 09/03/2016  . Bilateral hearing loss 09/03/2016  . History of neutropenia 09/03/2016    Past Medical History:  Diagnosis Date  . Hearing difficulty   . Hypercholesteremia   . Rash   . Rheumatoid arthritis ([place])   . Thyroid disease     Family History  Problem Relation Age of Onset  . Cancer Father        prostate,lung  . Cancer Other   . Seizures Other   . Thyroid disease Other    Past Surgical History:  Procedure Laterality Date  . THYROID LOBECTOMY  10/21/11  . THYROID LOBECTOMY  10/21/2011   Procedure: THYROID LOBECTOMY;  Surgeon: Haywood Lasso, MD;  Location: [facility];  Service: General;  Laterality: Left;  Removal left lobe of thyroid   Social History   Social History Narrative  . Not on file    There is no immunization history on file for this patient.   Objective: Vital Signs: There were no vitals taken for this visit.   Physical Exam   Musculoskeletal Exam: ***  CDAI Exam: CDAI Score: -- Patient Global: --; Provider Global: -- Swollen: --; Tender: -- Joint Exam 06/19/2020   No joint exam has been documented for this visit   There is currently no information documented on the homunculus. Go to the Rheumatology activity and complete the homunculus joint exam.  Investigation: No additional findings.  Imaging: No results found.  Recent Labs: Lab Results  Component Value Date   WBC 7.8 04/22/2020   HGB 13.8 04/22/2020   PLT 226 04/22/2020   NA 142 04/22/2020   K 4.1 04/22/2020   CL 107 04/22/2020   CO2 26 04/22/2020   GLUCOSE 105 (H) 04/22/2020   BUN 15 04/22/2020   CREATININE 1.04 04/22/2020   BILITOT 0.4 04/22/2020   ALKPHOS 35 (L) 02/04/2017   AST 13 04/22/2020   ALT 20 04/22/2020   PROT 6.5 04/22/2020   ALBUMIN 4.1 02/04/2017   CALCIUM 8.7 04/22/2020   GFRAA 95 04/22/2020    Speciality Comments: PLQ EYE Exam: 09/07/18 WNL @  [facility] follow up 1 year  Procedures:  No procedures performed Allergies: Patient has no known allergies.   Assessment / Plan:  Visit Diagnoses: Seropositive rheumatoid arthritis ([place]) - +RF +CCP severe disease with synovitis  - US exam in 12/2016 revealed only mild synovitis  High risk medication use -  Methotrexate 8 tablets po once weekly and folic acid 2 mg by mouth daily.  Primary osteoarthritis of both hands  Primary osteoarthritis of both feet  Flexion contracture of left elbow  Flexion contracture of right elbow  Chondromalacia of both patellae  Morton's neuroma of both feet  History of goiter  History of  neutropenia  Dyslipidemia  Orders: No orders of the defined types were placed in this encounter.  No orders of the defined types were placed in this encounter.   Face-to-face time spent with patient was *** minutes. Greater than 50% of time was spent in counseling and coordination of care.  Follow-Up Instructions: No follow-ups on file.   Ofilia Neas, PA-C  Note - This record has been created using Dragon software.  Chart creation errors have been sought, but may not always  have been located. Such creation errors do not reflect on  the standard of medical care.

## 2020-06-19 ENCOUNTER — Ambulatory Visit: Payer: BC Managed Care – PPO | Admitting: Physician Assistant

## 2020-06-20 NOTE — Progress Notes (Deleted)
Office Visit Note  Patient: Christian Gentry             Date of Birth: 07-Aug-1966           MRN: [id number]             PCP: Christain Sacramento, MD Referring: Christain Sacramento, MD Visit Date: 06/26/2020 Occupation: @GUAROCC @  Subjective:  No chief complaint on file.   History of Present Illness: Christian Jeffrey Graefe is a 54 y.o. male ***   Activities of Daily Living:  Patient reports morning stiffness for *** {minute/hour:19697}.   Patient {ACTIONS;DENIES/REPORTS:21021675::"Denies"} nocturnal pain.  Difficulty dressing/grooming: {ACTIONS;DENIES/REPORTS:21021675::"Denies"} Difficulty climbing stairs: {ACTIONS;DENIES/REPORTS:21021675::"Denies"} Difficulty getting out of chair: {ACTIONS;DENIES/REPORTS:21021675::"Denies"} Difficulty using hands for taps, buttons, cutlery, and/or writing: {ACTIONS;DENIES/REPORTS:21021675::"Denies"}  No Rheumatology ROS completed.   PMFS History:  Patient Active Problem List   Diagnosis Date Noted  . 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 Seropositive rheumatoid arthritis ([place]) 09/03/2016  . High risk medication use 09/03/2016  . Primary osteoarthritis of both hands 09/03/2016  . Primary osteoarthritis of both feet 09/03/2016  . Flexion contracture of elbow 09/03/2016  . 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 Plantar fasciitis 09/03/2016  . Dyslipidemia 09/03/2016  . Goiter 09/03/2016  . Bilateral hearing loss 09/03/2016  . History of neutropenia 09/03/2016    Past Medical History:  Diagnosis Date  . Hearing difficulty   . Hypercholesteremia   . Rash   . Rheumatoid arthritis ([place])   . Thyroid disease     Family History  Problem Relation Age of Onset  . Cancer Father        prostate,lung  . Cancer Other   . Seizures Other   . Thyroid disease Other    Past Surgical History:  Procedure Laterality Date  . THYROID LOBECTOMY  10/21/11  . THYROID LOBECTOMY  10/21/2011   Procedure: THYROID LOBECTOMY;  Surgeon: Haywood Lasso, MD;  Location: [facility];  Service: General;  Laterality: Left;  Removal left lobe of thyroid   Social History   Social History Narrative  . Not on file    There is no immunization history on file for this patient.   Objective: Vital Signs: There were no vitals taken for this visit.   Physical Exam   Musculoskeletal Exam: ***  CDAI Exam: CDAI Score: -- Patient Global: --; Provider Global: -- Swollen: --; Tender: -- Joint Exam 06/26/2020   No joint exam has been documented for this visit   There is currently no information documented on the homunculus. Go to the Rheumatology activity and complete the homunculus joint exam.  Investigation: No additional findings.  Imaging: No results found.  Recent Labs: Lab Results  Component Value Date   WBC 7.8 04/22/2020   HGB 13.8 04/22/2020   PLT 226 04/22/2020   NA 142 04/22/2020   K 4.1 04/22/2020   CL 107 04/22/2020   CO2 26 04/22/2020   GLUCOSE 105 (H) 04/22/2020   BUN 15 04/22/2020   CREATININE 1.04 04/22/2020   BILITOT 0.4 04/22/2020   ALKPHOS 35 (L) 02/04/2017   AST 13 04/22/2020   ALT 20 04/22/2020   PROT 6.5 04/22/2020   ALBUMIN 4.1 02/04/2017   CALCIUM 8.7 04/22/2020   GFRAA 95 04/22/2020    Speciality Comments: PLQ EYE Exam: 09/07/18 WNL @  [facility] follow up 1 year  Procedures:  No procedures performed Allergies: Patient has no known allergies.   Assessment / Plan:  Visit Diagnoses: Seropositive rheumatoid arthritis ([place])  High risk medication use  Flexion contracture of left elbow  Flexion contracture of right elbow  Primary osteoarthritis of both hands  Primary osteoarthritis of both feet  Chondromalacia of both patellae  Morton's neuroma of both feet  History of goiter  History of neutropenia  Dyslipidemia  Orders: No orders of the defined types were placed in this encounter.  No orders of the defined types were placed in this encounter.   Face-to-face time spent  with patient was *** minutes. Greater than 50% of time was spent in counseling and coordination of care.  Follow-Up Instructions: No follow-ups on file.   Ofilia Neas, PA-C  Note - This record has been created using Dragon software.  Chart creation errors have been sought, but may not always  have been located. Such creation errors do not reflect on  the standard of medical care.

## 2020-06-26 ENCOUNTER — Ambulatory Visit: Payer: BC Managed Care – PPO | Admitting: Rheumatology

## 2020-06-26 DIAGNOSIS — M19071 Primary osteoarthritis, right ankle and foot: Secondary | ICD-10-CM

## 2020-06-26 DIAGNOSIS — G5763 Lesion of plantar nerve, bilateral lower limbs: Secondary | ICD-10-CM

## 2020-06-26 DIAGNOSIS — Z862 Personal history of diseases of the blood and blood-forming organs and certain disorders involving the immune mechanism: Secondary | ICD-10-CM

## 2020-06-26 DIAGNOSIS — M19041 Primary osteoarthritis, right hand: Secondary | ICD-10-CM

## 2020-06-26 DIAGNOSIS — M24521 Contracture, right elbow: Secondary | ICD-10-CM

## 2020-06-26 DIAGNOSIS — M2241 Chondromalacia patellae, right knee: Secondary | ICD-10-CM

## 2020-06-26 DIAGNOSIS — Z8639 Personal history of other endocrine, nutritional and metabolic disease: Secondary | ICD-10-CM

## 2020-06-26 DIAGNOSIS — M059 Rheumatoid arthritis with rheumatoid factor, unspecified: Secondary | ICD-10-CM

## 2020-06-26 DIAGNOSIS — E785 Hyperlipidemia, unspecified: Secondary | ICD-10-CM

## 2020-06-26 DIAGNOSIS — Z79899 Other long term (current) drug therapy: Secondary | ICD-10-CM

## 2020-06-26 DIAGNOSIS — M24522 Contracture, left elbow: Secondary | ICD-10-CM

## 2020-06-27 NOTE — Progress Notes (Signed)
Office Visit Note  Patient: Christian Gentry             Date of Birth: 10-Jul-1966           MRN: [id number]             PCP: Christain Sacramento, MD Referring: Christain Sacramento, MD Visit Date: 07/11/2020 Occupation: @GUAROCC @  Subjective:  Rheumatoid Arthritis (Doing good)   History of Present Illness: Christian Clifton Safley is a 54 y.o. male with history of seropositive rheumatoid arthritis.  He states he has been doing well without any joint pain or joint swelling.  He forgets to take folic acid.  He has been taking methotrexate on a regular basis.  He has occasional minimal stiffness after doing activities.  He has noticed decreased urinary output and frequency of urination.  He has not seen his PCP in a long time.  Activities of Daily Living:  Patient reports morning stiffness for 0 none.   Patient Denies nocturnal pain.  Difficulty dressing/grooming: Denies Difficulty climbing stairs: Denies Difficulty getting out of chair: Denies Difficulty using hands for taps, buttons, cutlery, and/or writing: Denies  Review of Systems  Constitutional: Negative for fatigue.  HENT: Negative for mouth dryness.   Eyes: Positive for dryness.  Respiratory: Negative for shortness of breath.   Cardiovascular: Negative for swelling in legs/feet.  Gastrointestinal: Negative for constipation.  Endocrine: Negative for excessive thirst.  Genitourinary: Positive for decreased urine output.  Musculoskeletal: Negative for arthralgias and joint pain.  Skin: Negative for rash.  Allergic/Immunologic: Negative for susceptible to infections.  Neurological: Negative for numbness.  Hematological: Negative for bruising/bleeding tendency.  Psychiatric/Behavioral: Negative for sleep disturbance.    PMFS History:  Patient Active Problem List   Diagnosis Date Noted  . 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 Seropositive rheumatoid arthritis ([place]) 09/03/2016  . High risk medication use 09/03/2016  .  Primary osteoarthritis of both hands 09/03/2016  . Primary osteoarthritis of both feet 09/03/2016  . Flexion contracture of elbow 09/03/2016  . 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 Plantar fasciitis 09/03/2016  . Dyslipidemia 09/03/2016  . Goiter 09/03/2016  . Bilateral hearing loss 09/03/2016  . History of neutropenia 09/03/2016    Past Medical History:  Diagnosis Date  . Hearing difficulty   . Hypercholesteremia   . Rash   . Rheumatoid arthritis ([place])   . Thyroid disease     Family History  Problem Relation Age of Onset  . Cancer Father        prostate,lung  . Cancer Other   . Seizures Other   . Thyroid disease Other    Past Surgical History:  Procedure Laterality Date  . THYROID LOBECTOMY  10/21/11  . THYROID LOBECTOMY  10/21/2011   Procedure: THYROID LOBECTOMY;  Surgeon: Haywood Lasso, MD;  Location: [facility];  Service: General;  Laterality: Left;  Removal left lobe of thyroid   Social History   Social History Narrative  . Not on file    There is no immunization history on file for this patient.   Objective: Vital Signs: BP 119/81 (BP Location: Left Arm, Patient Position: Sitting, Cuff Size: Normal)   Pulse 68   Resp 15   Ht 6\' 2"  (1.88 m)   Wt 211 lb 9.6 oz (96 kg)   BMI 27.17 kg/m    Physical Exam Vitals and nursing note reviewed.  Constitutional:      Appearance: He is well-developed.  HENT:  Head: Normocephalic and atraumatic.  Eyes:     Conjunctiva/sclera: Conjunctivae normal.     Pupils: Pupils are equal, round, and reactive to light.  Cardiovascular:     Rate and Rhythm: Normal rate and regular rhythm.     Heart sounds: Normal heart sounds.  Pulmonary:     Effort: Pulmonary effort is normal.     Breath sounds: Normal breath sounds.  Abdominal:     General: Bowel sounds are normal.     Palpations: Abdomen is soft.  Musculoskeletal:     Cervical back: Normal range of motion and neck supple.  Skin:    General: Skin is warm and  dry.     Capillary Refill: Capillary refill takes less than 2 seconds.  Neurological:     Mental Status: He is alert and oriented to person, place, and time.  Psychiatric:        Behavior: Behavior normal.      Musculoskeletal Exam: C-spine thoracic and lumbar spine were in good range of motion.  Shoulder joints, elbow joints, wrist joints, MCPs PIPs and DIPs been good range of motion with no synovitis.  Hip joints, knee joints, ankles, MTPs and PIPs with good range of motion with no synovitis.  CDAI Exam: CDAI Score: 0.2  Patient Global: 1 mm; Provider Global: 1 mm Swollen: 0 ; Tender: 0  Joint Exam 07/11/2020   No joint exam has been documented for this visit   There is currently no information documented on the homunculus. Go to the Rheumatology activity and complete the homunculus joint exam.  Investigation: No additional findings.  Imaging: No results found.  Recent Labs: Lab Results  Component Value Date   WBC 7.8 04/22/2020   HGB 13.8 04/22/2020   PLT 226 04/22/2020   NA 142 04/22/2020   K 4.1 04/22/2020   CL 107 04/22/2020   CO2 26 04/22/2020   GLUCOSE 105 (H) 04/22/2020   BUN 15 04/22/2020   CREATININE 1.04 04/22/2020   BILITOT 0.4 04/22/2020   ALKPHOS 35 (L) 02/04/2017   AST 13 04/22/2020   ALT 20 04/22/2020   PROT 6.5 04/22/2020   ALBUMIN 4.1 02/04/2017   CALCIUM 8.7 04/22/2020   GFRAA 95 04/22/2020    Speciality Comments: PLQ EYE Exam: 09/07/18 WNL @  [facility] follow up 1 year  Procedures:  No procedures performed Allergies: Patient has no known allergies.   Assessment / Plan:     Visit Diagnoses: Seropositive rheumatoid arthritis ([place]) - +RF +CCP severe disease with synovitis  - US exam in 12/2016 revealed only mild synovitis: He is clinically doing very well with no synovitis on examination today.  He wants to try decreasing methotrexate to 7 tablets p.o. weekly.  I was in agreement.  If he has increased discomfort then he can go back to 8  tablets p.o. weekly.  I have also advised to take folic acid on a regular basis.  High risk medication use - Methotrexate 8 tablets po once weekly and folic acid 2 mg by mouth daily.  - Plan: CBC with Differential/Platelet, COMPLETE METABOLIC PANEL WITH GFR  Primary osteoarthritis of both hands-he has mild osteoarthritis which causes stiffness.  Primary osteoarthritis of both feet-he has DIP and PIP prominence but no synovitis.  Flexion contracture of left elbow-unchanged  Flexion contracture of right elbow  Chondromalacia of both patellae-currently not having much discomfort. Dyslipidemia  History of goiter  Urinary retention -he has been complaining of urinary retention urinary frequency.  Have advised  him to schedule an appointment for his PCP for prostate examination.  Per his request I will add PSA to the lab work.  Plan: PSA  Educated about COVID-19 virus infection-he has not been immunized.  He has detailed discussion regarding getting immunization against COVID-19.  Risk of not having immunization was discussed.  Delaying methotrexate for 1 week after booster was discussed.  Use of mask, social distancing and hand hygiene was discussed.  Use of monoclonal antibodies were discussed in case he develops COVID-19 infection.  Orders: Orders Placed This Encounter  Procedures  . CBC with Differential/Platelet  . COMPLETE METABOLIC PANEL WITH GFR  . PSA   No orders of the defined types were placed in this encounter.    Follow-Up Instructions: Return in about 5 months (around 12/09/2020) for Rheumatoid arthritis.   Bo Merino, MD  Note - This record has been created using Editor, commissioning.  Chart creation errors have been sought, but may not always  have been located. Such creation errors do not reflect on  the standard of medical care.

## 2020-07-11 ENCOUNTER — Ambulatory Visit: Payer: BC Managed Care – PPO | Admitting: Rheumatology

## 2020-07-11 ENCOUNTER — Other Ambulatory Visit: Payer: Self-pay

## 2020-07-11 ENCOUNTER — Encounter: Payer: Self-pay | Admitting: Rheumatology

## 2020-07-11 VITALS — BP 119/81 | HR 68 | Resp 15 | Ht 74.0 in | Wt 211.6 lb

## 2020-07-11 DIAGNOSIS — Z8639 Personal history of other endocrine, nutritional and metabolic disease: Secondary | ICD-10-CM

## 2020-07-11 DIAGNOSIS — M19071 Primary osteoarthritis, right ankle and foot: Secondary | ICD-10-CM | POA: Diagnosis not present

## 2020-07-11 DIAGNOSIS — M2241 Chondromalacia patellae, right knee: Secondary | ICD-10-CM

## 2020-07-11 DIAGNOSIS — Z79899 Other long term (current) drug therapy: Secondary | ICD-10-CM | POA: Diagnosis not present

## 2020-07-11 DIAGNOSIS — M2242 Chondromalacia patellae, left knee: Secondary | ICD-10-CM

## 2020-07-11 DIAGNOSIS — Z862 Personal history of diseases of the blood and blood-forming organs and certain disorders involving the immune mechanism: Secondary | ICD-10-CM

## 2020-07-11 DIAGNOSIS — M24522 Contracture, left elbow: Secondary | ICD-10-CM

## 2020-07-11 DIAGNOSIS — M19041 Primary osteoarthritis, right hand: Secondary | ICD-10-CM | POA: Diagnosis not present

## 2020-07-11 DIAGNOSIS — M19072 Primary osteoarthritis, left ankle and foot: Secondary | ICD-10-CM

## 2020-07-11 DIAGNOSIS — E785 Hyperlipidemia, unspecified: Secondary | ICD-10-CM

## 2020-07-11 DIAGNOSIS — R339 Retention of urine, unspecified: Secondary | ICD-10-CM

## 2020-07-11 DIAGNOSIS — M059 Rheumatoid arthritis with rheumatoid factor, unspecified: Secondary | ICD-10-CM | POA: Diagnosis not present

## 2020-07-11 DIAGNOSIS — Z7189 Other specified counseling: Secondary | ICD-10-CM

## 2020-07-11 DIAGNOSIS — M19042 Primary osteoarthritis, left hand: Secondary | ICD-10-CM

## 2020-07-11 DIAGNOSIS — G5763 Lesion of plantar nerve, bilateral lower limbs: Secondary | ICD-10-CM

## 2020-07-11 DIAGNOSIS — M24521 Contracture, right elbow: Secondary | ICD-10-CM

## 2020-07-11 NOTE — Patient Instructions (Signed)
Standing Labs We placed an order today for your standing lab work.   Please have your standing labs drawn in February and every 3 months  If possible, please have your labs drawn 2 weeks prior to your appointment so that the provider can discuss your results at your appointment.  We have open lab daily Monday through Thursday from 8:30-12:30 PM and 1:30-4:30 PM and Friday from 8:30-12:30 PM and 1:30-4:00 PM at the office of Dr. Bo Merino, [facility].   Please be advised, patients with office appointments requiring lab work will take precedents over walk-in lab work.  If possible, please come for your lab work on Monday and Friday afternoons, as you may experience shorter wait times. The office is located at [address], [place], [place], [place] [address] No appointment is necessary.   Labs are drawn by [facility]. Please bring your co-pay at the time of your lab draw.  You may receive a bill from [facility] for your lab work.  If you wish to have your labs drawn at another location, please call the office 24 hours in advance to send orders.  If you have any questions regarding directions or hours of operation,  please call [phone].   As a reminder, please drink plenty of water prior to coming for your lab work. Thanks!  COVID-19 vaccine recommendations:   COVID-19 vaccine is recommended for everyone (unless you are allergic to a vaccine component), even if you are on a medication that suppresses your immune system.   If you are on Methotrexate, Cellcept (mycophenolate), Rinvoq, Morrie Sheldon, and Olumiant- hold the medication for 1 week after each vaccine. Hold Methotrexate for 2 weeks after the single dose COVID-19 vaccine.   Do not take Tylenol or any anti-inflammatory medications (NSAIDs) 24 hours prior to the COVID-19 vaccination.   There is no direct evidence about the efficacy of the COVID-19 vaccine in individuals who are on medications that suppress the immune  system.   Even if you are fully vaccinated, and you are on any medications that suppress your immune system, please continue to wear a mask, maintain at least six feet social distance and practice hand hygiene.   If you develop a COVID-19 infection, please contact your PCP or our office to determine if you need monoclonal antibody infusion.  The booster vaccine is now available for immunocompromised patients.   Please see the following web sites for updated information.   [email or web address]

## 2020-07-12 LAB — CBC WITH DIFFERENTIAL/PLATELET
Absolute Monocytes: 413 cells/uL (ref 200–950)
Basophils Absolute: 41 cells/uL (ref 0–200)
Basophils Relative: 0.8 %
Eosinophils Absolute: 112 cells/uL (ref 15–500)
Eosinophils Relative: 2.2 %
HCT: 41.7 % (ref 38.5–50.0)
Hemoglobin: 13.7 g/dL (ref 13.2–17.1)
Lymphs Abs: 1224 cells/uL (ref 850–3900)
MCH: 29.9 pg (ref 27.0–33.0)
MCHC: 32.9 g/dL (ref 32.0–36.0)
MCV: 91 fL (ref 80.0–100.0)
MPV: 10.4 fL (ref 7.5–12.5)
Monocytes Relative: 8.1 %
Neutro Abs: 3310 cells/uL (ref 1500–7800)
Neutrophils Relative %: 64.9 %
Platelets: 222 10*3/uL (ref 140–400)
RBC: 4.58 10*6/uL (ref 4.20–5.80)
RDW: 13.9 % (ref 11.0–15.0)
Total Lymphocyte: 24 %
WBC: 5.1 10*3/uL (ref 3.8–10.8)

## 2020-07-12 LAB — COMPLETE METABOLIC PANEL WITH GFR
AG Ratio: 2 (calc) (ref 1.0–2.5)
ALT: 27 U/L (ref 9–46)
AST: 18 U/L (ref 10–35)
Albumin: 4.5 g/dL (ref 3.6–5.1)
Alkaline phosphatase (APISO): 44 U/L (ref 35–144)
BUN: 13 mg/dL (ref 7–25)
CO2: 28 mmol/L (ref 20–32)
Calcium: 9.3 mg/dL (ref 8.6–10.3)
Chloride: 106 mmol/L (ref 98–110)
Creat: 0.81 mg/dL (ref 0.70–1.33)
GFR, Est African American: 118 mL/min/{1.73_m2} (ref >=60)
GFR, Est Non African American: 101 mL/min/{1.73_m2} (ref >=60)
Globulin: 2.2 g/dL (calc) (ref 1.9–3.7)
Glucose, Bld: 94 mg/dL (ref 65–99)
Potassium: 4.7 mmol/L (ref 3.5–5.3)
Sodium: 142 mmol/L (ref 135–146)
Total Bilirubin: 0.6 mg/dL (ref 0.2–1.2)
Total Protein: 6.7 g/dL (ref 6.1–8.1)

## 2020-07-12 LAB — PSA: PSA: 6.74 ng/mL — ABNORMAL HIGH (ref <=4.0)

## 2020-07-13 ENCOUNTER — Other Ambulatory Visit: Payer: Self-pay | Admitting: Rheumatology

## 2020-07-14 NOTE — Telephone Encounter (Signed)
Last Visit: 07/11/2020 Next Visit: 12/05/2020 Labs: 07/11/2020 WNL  Current Dose per office note 07/11/2020: Methotrexate 8 tablets po once weekly  DX: Seropositive rheumatoid arthritis   Okay to refill per Dr. Estanislado Pandy

## 2020-07-14 NOTE — Progress Notes (Signed)
PSA is elevated.  CBC and CMP are normal.  Patient will discuss results with his PCP.  If he wants urology referral we can refer him to urologist.

## 2020-10-03 ENCOUNTER — Other Ambulatory Visit: Payer: Self-pay | Admitting: Rheumatology

## 2020-10-03 NOTE — Telephone Encounter (Signed)
Last Visit: 07/11/2020 Next Visit: 12/05/2020 Labs: 07/11/2020, PSA is elevated. CBC and CMP are normal. Patient will discuss results with his PCP.   Current Dose per office note 07/11/2020, Methotrexate 8 tablets po once weekly  DX: Seropositive rheumatoid arthritis   Okay to refill MTX

## 2020-11-12 ENCOUNTER — Other Ambulatory Visit: Payer: Self-pay | Admitting: Rheumatology

## 2020-11-12 NOTE — Telephone Encounter (Signed)
Last Visit: 07/11/2020 Next Visit: 12/05/2020  Current Dose per office note on 95/18/8416, folic acid 2 mg by mouth daily. Dx: Seropositive rheumatoid arthritis   Last Fill:09/24/2019  Okay to refill folic acid?

## 2020-11-21 NOTE — Progress Notes (Signed)
Office Visit Note  Patient: Christian Gentry             Date of Birth: [birth year]           MRN: [id number]             PCP: Christain Sacramento, MD Referring: Christain Sacramento, MD Visit Date: 12/05/2020 Occupation: @GUAROCC @  Subjective:  Medication monitoring   History of Present Illness: Christian Gentry is a 55 y.o. male with history of seropositive rheumatoid arthritis and osteoarthritis.  He is taking methotrexate 7 tablets by mouth once weekly and folic acid 2 mg by mouth daily.  She is tolerating methotrexate without any side effects.  According to the patient he was diagnosed with COVID-19 on New Year's Eve.  He states that he held methotrexate for a while 1 week until his symptoms had completely resolved.  He denies any recent rheumatoid arthritis flares.  He states that he experiences some discomfort in bilateral fourth toes but denies any joint swelling.  He states his discomfort is exacerbated by wearing tight shoes.  He denies any plantar fasciitis at this time.  He is not experiencing any ankle pain or swelling currently.  He started walking for exercise about 2 weeks ago he states he experiences some stiffness in his left hand with overuse activities.  He denies any other joint pain or joint swelling at this time. Patient reports he has an upcoming appointment with urologist to discuss elevated PSA in the past.      Activities of Daily Living:  Patient reports morning stiffness for 0 minutes.   Patient Denies nocturnal pain.  Difficulty dressing/grooming: Denies Difficulty climbing stairs: Denies Difficulty getting out of chair: Denies Difficulty using hands for taps, buttons, cutlery, and/or writing: Denies  Review of Systems  Constitutional: Negative for fatigue.  HENT: Negative for mouth sores, mouth dryness and nose dryness.   Eyes: Positive for dryness. Negative for pain and itching.  Respiratory: Negative for shortness of breath and difficulty breathing.    Cardiovascular: Negative for chest pain and palpitations.  Gastrointestinal: Negative for blood in stool, constipation and diarrhea.  Endocrine: Negative for increased urination.  Genitourinary: Negative for difficulty urinating.  Musculoskeletal: Negative for arthralgias, joint pain, joint swelling, myalgias, morning stiffness, muscle tenderness and myalgias.  Skin: Negative for color change, rash and redness.  Allergic/Immunologic: Negative for susceptible to infections.  Neurological: Negative for dizziness, numbness, headaches, memory loss and weakness.  Hematological: Negative for bruising/bleeding tendency.  Psychiatric/Behavioral: Negative for confusion.    PMFS History:  Patient Active Problem List   Diagnosis Date Noted  . 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 Seropositive rheumatoid arthritis ([place]) 09/03/2016  . High risk medication use 09/03/2016  . Primary osteoarthritis of both hands 09/03/2016  . Primary osteoarthritis of both feet 09/03/2016  . Flexion contracture of elbow 09/03/2016  . 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 Plantar fasciitis 09/03/2016  . Dyslipidemia 09/03/2016  . Goiter 09/03/2016  . Bilateral hearing loss 09/03/2016  . History of neutropenia 09/03/2016    Past Medical History:  Diagnosis Date  . Hearing difficulty   . Hypercholesteremia   . Rash   . Rheumatoid arthritis ([place])   . Thyroid disease     Family History  Problem Relation Age of Onset  . Cancer Father        prostate,lung  . Cancer Other   . Seizures Other   . Thyroid disease Other    Past  Surgical History:  Procedure Laterality Date  . THYROID LOBECTOMY  10/21/11  . THYROID LOBECTOMY  10/21/2011   Procedure: THYROID LOBECTOMY;  Surgeon: Haywood Lasso, MD;  Location: [facility];  Service: General;  Laterality: Left;  Removal left lobe of thyroid   Social History   Social History Narrative  . Not on file    There is no immunization history on file for  this patient.   Objective: Vital Signs: BP 128/88 (BP Location: Left Arm, Patient Position: Sitting, Cuff Size: Normal)   Pulse 69   Resp 16   Ht 6\' 2"  (1.88 m)   Wt 218 lb 6.4 oz (99.1 kg)   BMI 28.04 kg/m    Physical Exam Vitals and nursing note reviewed.  Constitutional:      Appearance: He is well-developed.  HENT:     Head: Normocephalic and atraumatic.  Eyes:     Conjunctiva/sclera: Conjunctivae normal.     Pupils: Pupils are equal, round, and reactive to light.  Pulmonary:     Effort: Pulmonary effort is normal.  Abdominal:     Palpations: Abdomen is soft.  Musculoskeletal:     Cervical back: Normal range of motion and neck supple.  Skin:    General: Skin is warm and dry.     Capillary Refill: Capillary refill takes less than 2 seconds.  Neurological:     Mental Status: He is alert and oriented to person, place, and time.  Psychiatric:        Behavior: Behavior normal.      Musculoskeletal Exam: C-spine, thoracic spine, lumbar spine have good range of motion with no discomfort.  Shoulder joints have good range of motion with no discomfort.  Mild flexion contractures of both elbows noted.  Wrist joints, MCPs, PIPs, DIPs have good range of motion with no synovitis.  He is able to make a complete fist bilaterally.  PIP and DIP thickening consistent with osteoarthritis of both hands noted.  Hip joints have good range of motion with no discomfort.  Knee joints have good range of motion with crepitus bilaterally.  No warmth or effusion of the knee joints were noted on examination today.  Ankle joints have good range of motion with no tenderness or inflammation.  No tenderness or synovitis of MTP joints.  He has PIP and DIP thickening consistent with osteoarthritis of both feet.  No tenderness along the Achilles tendon or plantar fascia.  CDAI Exam: CDAI Score: 0.4  Patient Global: 2 mm; Provider Global: 2 mm Swollen: 0 ; Tender: 0  Joint Exam 12/05/2020   No joint exam  has been documented for this visit   There is currently no information documented on the homunculus. Go to the Rheumatology activity and complete the homunculus joint exam.  Investigation: No additional findings.  Imaging: No results found.  Recent Labs: Lab Results  Component Value Date   WBC 5.1 07/11/2020   HGB 13.7 07/11/2020   PLT 222 07/11/2020   NA 142 07/11/2020   K 4.7 07/11/2020   CL 106 07/11/2020   CO2 28 07/11/2020   GLUCOSE 94 07/11/2020   BUN 13 07/11/2020   CREATININE 0.81 07/11/2020   BILITOT 0.6 07/11/2020   ALKPHOS 35 (L) 02/04/2017   AST 18 07/11/2020   ALT 27 07/11/2020   PROT 6.7 07/11/2020   ALBUMIN 4.1 02/04/2017   CALCIUM 9.3 07/11/2020   GFRAA 118 07/11/2020    Speciality Comments: PLQ EYE Exam: 09/07/18 WNL @  [facility]  follow up 1 year  Procedures:  No procedures performed Allergies: Patient has no known allergies.   Assessment / Plan:     Visit Diagnoses: Seropositive rheumatoid arthritis ([place]) - +RF +CCP severe disease with synovitis, U/s exam in 12/2016 revealed only mild synovitis: He has no joint tenderness or synovitis on exam.  He has not had any recent rheumatoid arthritis flares.  He is clinically doing well on methotrexate 7 tablets by mouth once weekly and folic acid 2 mg by mouth daily.  He experiences intermittent stiffness in the left hand which is typically exacerbated by overuse activities.  He is able to make a complete fist bilaterally.  He has been experiencing intermittent pain in the fourth toe bilaterally.  He has no joint tenderness or inflammation on examination today.  X-rays of both hands and feet were updated today to assess for radiographic progression.  He will continue taking methotrexate 7 tablets by mouth once weekly and folic acid 2 mg by mouth daily.  He does not need a refill at this time.  He was advised to notify us if he develops increased joint pain or joint swelling.  He will follow-up in the office in 5  months.- Plan: XR Hand 2 View Right, XR Hand 2 View Left, XR Foot 2 Views Right, XR Foot 2 Views Left  High risk medication use - Methotrexate 7 tablets po once weekly and folic acid 2 mg by mouth daily.  CBC and CMP updated on 07/11/20.  He is due to update lab work.  Orders for CBC and CMP released. His next lab work will be due in June and every 3 months.  Standing orders for CBC and CMP remain in place.- Plan: CBC with Differential/Platelet, COMPLETE METABOLIC PANEL WITH GFR He was diagnosed with COVID-19 on 09/12/2020.  He held methotrexate for 1 week until his symptoms had completely resolved. He has not received the COVID-19 vaccines and does not plan to at this time.   Primary osteoarthritis of both hands: He has PIP and DIP thickening consistent with osteoarthritis of both hands.  No joint tenderness or inflammation was noted.  He is able to make complete fist bilaterally.  He experiences intermittent stiffness in the left hand typically after overuse activities.  We discussed the importance of joint protection and muscle strengthening.  He was given a handout of hand exercises to perform.  Primary osteoarthritis of both feet: He experiences intermittent pain in bilateral fourth toes typically exacerbated by wearing tight shoes.  His discomfort is typically in bilateral fourth PIP joints.  He has PIP and DIP thickening consistent with osteoarthritis of both feet.  No tenderness or inflammation was noted on examination today.  He has good range of motion of both ankle joints with no tenderness or inflammation.  We discussed the importance of wearing proper fitting shoes.  X-rays of both feet were updated today.  Flexion contracture of left elbow - Unchanged.  No tenderness or inflammation was noted on exam.  Flexion contracture of right elbow-Unchanged.  No tenderness or inflammation was noted.  Chondromalacia of both patellae: He has good range of motion of both knee joints with crepitus  bilaterally.  No warmth or effusion was noted.  He has not been experiencing any discomfort in his knee joints recently.  He started walking for exercise about 2 weeks ago.  Other medical conditions are listed as follows:  History of goiter  Urinary retention  Orders: Orders Placed This Encounter  Procedures  .  XR Hand 2 View Right  . XR Hand 2 View Left  . XR Foot 2 Views Right  . XR Foot 2 Views Left  . CBC with Differential/Platelet  . COMPLETE METABOLIC PANEL WITH GFR   No orders of the defined types were placed in this encounter.    Follow-Up Instructions: Return in about 5 months (around 05/07/2021) for Rheumatoid arthritis, Osteoarthritis.   Ofilia Neas, PA-C  Note - This record has been created using Dragon software.  Chart creation errors have been sought, but may not always  have been located. Such creation errors do not reflect on  the standard of medical care.

## 2020-12-05 ENCOUNTER — Ambulatory Visit: Payer: Self-pay

## 2020-12-05 ENCOUNTER — Ambulatory Visit: Payer: BC Managed Care – PPO | Admitting: Physician Assistant

## 2020-12-05 ENCOUNTER — Encounter: Payer: Self-pay | Admitting: Physician Assistant

## 2020-12-05 ENCOUNTER — Other Ambulatory Visit: Payer: Self-pay

## 2020-12-05 VITALS — BP 128/88 | HR 69 | Resp 16 | Ht 74.0 in | Wt 218.4 lb

## 2020-12-05 DIAGNOSIS — Z8639 Personal history of other endocrine, nutritional and metabolic disease: Secondary | ICD-10-CM

## 2020-12-05 DIAGNOSIS — M2241 Chondromalacia patellae, right knee: Secondary | ICD-10-CM

## 2020-12-05 DIAGNOSIS — M24521 Contracture, right elbow: Secondary | ICD-10-CM

## 2020-12-05 DIAGNOSIS — M19072 Primary osteoarthritis, left ankle and foot: Secondary | ICD-10-CM

## 2020-12-05 DIAGNOSIS — M19041 Primary osteoarthritis, right hand: Secondary | ICD-10-CM

## 2020-12-05 DIAGNOSIS — M2242 Chondromalacia patellae, left knee: Secondary | ICD-10-CM

## 2020-12-05 DIAGNOSIS — M059 Rheumatoid arthritis with rheumatoid factor, unspecified: Secondary | ICD-10-CM

## 2020-12-05 DIAGNOSIS — M19071 Primary osteoarthritis, right ankle and foot: Secondary | ICD-10-CM

## 2020-12-05 DIAGNOSIS — Z79899 Other long term (current) drug therapy: Secondary | ICD-10-CM | POA: Diagnosis not present

## 2020-12-05 DIAGNOSIS — R339 Retention of urine, unspecified: Secondary | ICD-10-CM

## 2020-12-05 DIAGNOSIS — M24522 Contracture, left elbow: Secondary | ICD-10-CM

## 2020-12-05 DIAGNOSIS — M19042 Primary osteoarthritis, left hand: Secondary | ICD-10-CM | POA: Diagnosis not present

## 2020-12-05 NOTE — Patient Instructions (Signed)
Standing Labs We placed an order today for your standing lab work.   Please have your standing labs drawn in June and every 3 months   If possible, please have your labs drawn 2 weeks prior to your appointment so that the provider can discuss your results at your appointment.  We have open lab daily Monday through Thursday from 1:30-4:30 PM and Friday from 1:30-4:00 PM at the office of Dr. Shaili Deveshwar, [facility].   Please be advised, all patients with office appointments requiring lab work will take precedents over walk-in lab work.  If possible, please come for your lab work on Monday and Friday afternoons, as you may experience shorter wait times. The office is located at [address], [address], [place], [place] [address] No appointment is necessary.   Labs are drawn by [facility]. Please bring your co-pay at the time of your lab draw.  You may receive a bill from [facility] for your lab work.  If you wish to have your labs drawn at another location, please call the office 24 hours in advance to send orders.  If you have any questions regarding directions or hours of operation,  please call [phone].   As a reminder, please drink plenty of water prior to coming for your lab work. Thanks!   Hand Exercises Hand exercises can be helpful for almost anyone. These exercises can strengthen the hands, improve flexibility and movement, and increase blood flow to the hands. These results can make work and daily tasks easier. Hand exercises can be especially helpful for people who have joint pain from arthritis or have nerve damage from overuse (carpal tunnel syndrome). These exercises can also help people who have injured a hand. Exercises Most of these hand exercises are gentle stretching and motion exercises. It is usually safe to do them often throughout the day. Warming up your hands before exercise may help to reduce stiffness. You can do this with gentle massage or by placing  your hands in warm water for 10-15 minutes. It is normal to feel some stretching, pulling, tightness, or mild discomfort as you begin new exercises. This will gradually improve. Stop an exercise right away if you feel sudden, severe pain or your pain gets worse. Ask your health care provider which exercises are best for you. Knuckle bend or "claw" fist 1. Stand or sit with your arm, hand, and all five fingers pointed straight up. Make sure to keep your wrist straight during the exercise. 2. Gently bend your fingers down toward your palm until the tips of your fingers are touching the top of your palm. Keep your big knuckle straight and just bend the small knuckles in your fingers. 3. Hold this position for __________ seconds. 4. Straighten (extend) your fingers back to the starting position. Repeat this exercise 5-10 times with each hand. Full finger fist 1. Stand or sit with your arm, hand, and all five fingers pointed straight up. Make sure to keep your wrist straight during the exercise. 2. Gently bend your fingers into your palm until the tips of your fingers are touching the middle of your palm. 3. Hold this position for __________ seconds. 4. Extend your fingers back to the starting position, stretching every joint fully. Repeat this exercise 5-10 times with each hand. Straight fist 1. Stand or sit with your arm, hand, and all five fingers pointed straight up. Make sure to keep your wrist straight during the exercise. 2. Gently bend your fingers at the big   knuckle, where your fingers meet your hand, and the middle knuckle. Keep the knuckle at the tips of your fingers straight and try to touch the bottom of your palm. 3. Hold this position for __________ seconds. 4. Extend your fingers back to the starting position, stretching every joint fully. Repeat this exercise 5-10 times with each hand. Tabletop 1. Stand or sit with your arm, hand, and all five fingers pointed straight up. Make sure  to keep your wrist straight during the exercise. 2. Gently bend your fingers at the big knuckle, where your fingers meet your hand, as far down as you can while keeping the small knuckles in your fingers straight. Think of forming a tabletop with your fingers. 3. Hold this position for __________ seconds. 4. Extend your fingers back to the starting position, stretching every joint fully. Repeat this exercise 5-10 times with each hand. Finger spread 1. Place your hand flat on a table with your palm facing down. Make sure your wrist stays straight as you do this exercise. 2. Spread your fingers and thumb apart from each other as far as you can until you feel a gentle stretch. Hold this position for __________ seconds. 3. Bring your fingers and thumb tight together again. Hold this position for __________ seconds. Repeat this exercise 5-10 times with each hand. Making circles 1. Stand or sit with your arm, hand, and all five fingers pointed straight up. Make sure to keep your wrist straight during the exercise. 2. Make a circle by touching the tip of your thumb to the tip of your index finger. 3. Hold for __________ seconds. Then open your hand wide. 4. Repeat this motion with your thumb and each finger on your hand. Repeat this exercise 5-10 times with each hand. Thumb motion 1. Sit with your forearm resting on a table and your wrist straight. Your thumb should be facing up toward the ceiling. Keep your fingers relaxed as you move your thumb. 2. Lift your thumb up as high as you can toward the ceiling. Hold for __________ seconds. 3. Bend your thumb across your palm as far as you can, reaching the tip of your thumb for the small finger (pinkie) side of your palm. Hold for __________ seconds. Repeat this exercise 5-10 times with each hand. Grip strengthening 1. Hold a stress ball or other soft ball in the middle of your hand. 2. Slowly increase the pressure, squeezing the ball as much as you can  without causing pain. Think of bringing the tips of your fingers into the middle of your palm. All of your finger joints should bend when doing this exercise. 3. Hold your squeeze for __________ seconds, then relax. Repeat this exercise 5-10 times with each hand.   Contact a health care provider if:  Your hand pain or discomfort gets much worse when you do an exercise.  Your hand pain or discomfort does not improve within 2 hours after you exercise. If you have any of these problems, stop doing these exercises right away. Do not do them again unless your health care provider says that you can. Get help right away if:  You develop sudden, severe hand pain or swelling. If this happens, stop doing these exercises right away. Do not do them again unless your health care provider says that you can. This information is not intended to replace advice given to you by your health care provider. Make sure you discuss any questions you have with your health care provider. Document Revised:   12/21/2018 Document Reviewed: 08/31/2018 Elsevier Patient Education  2021 Elsevier Inc.  

## 2020-12-05 NOTE — Progress Notes (Signed)
Please call the patient to review x-ray results.  X-rays of both feet were consistent with osteoarthritis.  No erosive changes.   X-rays of both hands were consistent with RA and OA overlap.  No radiographic progression sine 2019.  Continue on MTX as prescribed.

## 2020-12-06 LAB — COMPLETE METABOLIC PANEL WITH GFR
AG Ratio: 1.8 (calc) (ref 1.0–2.5)
ALT: 28 U/L (ref 9–46)
AST: 18 U/L (ref 10–35)
Albumin: 4.2 g/dL (ref 3.6–5.1)
Alkaline phosphatase (APISO): 36 U/L (ref 35–144)
BUN: 15 mg/dL (ref 7–25)
CO2: 25 mmol/L (ref 20–32)
Calcium: 8.9 mg/dL (ref 8.6–10.3)
Chloride: 108 mmol/L (ref 98–110)
Creat: 0.85 mg/dL (ref 0.70–1.33)
GFR, Est African American: 115 mL/min/{1.73_m2} (ref >=60)
GFR, Est Non African American: 99 mL/min/{1.73_m2} (ref >=60)
Globulin: 2.3 g/dL (calc) (ref 1.9–3.7)
Glucose, Bld: 93 mg/dL (ref 65–99)
Potassium: 4.1 mmol/L (ref 3.5–5.3)
Sodium: 142 mmol/L (ref 135–146)
Total Bilirubin: 0.6 mg/dL (ref 0.2–1.2)
Total Protein: 6.5 g/dL (ref 6.1–8.1)

## 2020-12-06 LAB — CBC WITH DIFFERENTIAL/PLATELET
Absolute Monocytes: 484 cells/uL (ref 200–950)
Basophils Absolute: 62 cells/uL (ref 0–200)
Basophils Relative: 1.2 %
Eosinophils Absolute: 99 cells/uL (ref 15–500)
Eosinophils Relative: 1.9 %
HCT: 41.1 % (ref 38.5–50.0)
Hemoglobin: 13.7 g/dL (ref 13.2–17.1)
Lymphs Abs: 1134 cells/uL (ref 850–3900)
MCH: 29.6 pg (ref 27.0–33.0)
MCHC: 33.3 g/dL (ref 32.0–36.0)
MCV: 88.8 fL (ref 80.0–100.0)
MPV: 11.5 fL (ref 7.5–12.5)
Monocytes Relative: 9.3 %
Neutro Abs: 3422 cells/uL (ref 1500–7800)
Neutrophils Relative %: 65.8 %
Platelets: 205 10*3/uL (ref 140–400)
RBC: 4.63 10*6/uL (ref 4.20–5.80)
RDW: 14.2 % (ref 11.0–15.0)
Total Lymphocyte: 21.8 %
WBC: 5.2 10*3/uL (ref 3.8–10.8)

## 2020-12-08 ENCOUNTER — Telehealth: Payer: Self-pay

## 2020-12-08 NOTE — Telephone Encounter (Signed)
Patient called stating he was returning a call regarding his labwork and x-ray results.  Call was transferred to [facility].

## 2020-12-08 NOTE — Progress Notes (Signed)
CBC and CMP WNL

## 2020-12-30 ENCOUNTER — Other Ambulatory Visit: Payer: Self-pay | Admitting: Physician Assistant

## 2020-12-30 NOTE — Telephone Encounter (Signed)
Next Visit: 05/01/2021  Last Visit: 12/05/2020  Last Fill: 10/03/2020  DX:  Seropositive rheumatoid arthritis   Current Dose per office note 12/05/2020, Methotrexate 7 tablets po once weekly   Labs: 12/05/2020, CBC and CMP WNL  Okay to refill MTX?

## 2021-02-21 ENCOUNTER — Other Ambulatory Visit: Payer: Self-pay | Admitting: Physician Assistant

## 2021-04-17 NOTE — Progress Notes (Deleted)
Office Visit Note  Patient: Christian Gentry             Date of Birth: Aug 25, 1966           MRN: [id number]             PCP: Christain Sacramento, MD Referring: Christain Sacramento, MD Visit Date: 05/01/2021 Occupation: '@GUAROCC'$ @  Subjective:  No chief complaint on file.   History of Present Illness: Christian Gentry is a 55 y.o. male ***   Activities of Daily Living:  Patient reports morning stiffness for *** {minute/hour:19697}.   Patient {ACTIONS;DENIES/REPORTS:21021675::"Denies"} nocturnal pain.  Difficulty dressing/grooming: {ACTIONS;DENIES/REPORTS:21021675::"Denies"} Difficulty climbing stairs: {ACTIONS;DENIES/REPORTS:21021675::"Denies"} Difficulty getting out of chair: {ACTIONS;DENIES/REPORTS:21021675::"Denies"} Difficulty using hands for taps, buttons, cutlery, and/or writing: {ACTIONS;DENIES/REPORTS:21021675::"Denies"}  No Rheumatology ROS completed.   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   Plantar fasciitis 09/03/2016   Dyslipidemia 09/03/2016   Goiter 09/03/2016   Bilateral hearing loss 09/03/2016   History of neutropenia 09/03/2016    Past Medical History:  Diagnosis Date   Hearing difficulty    Hypercholesteremia    Rash    Rheumatoid arthritis ([place])    Thyroid disease     Family History  Problem Relation Age of Onset   Cancer Father        prostate,lung   Cancer Other    Seizures Other    Thyroid disease Other    Past Surgical History:  Procedure Laterality Date   THYROID LOBECTOMY  10/21/11   THYROID LOBECTOMY  10/21/2011   Procedure: THYROID LOBECTOMY;  Surgeon: Haywood Lasso, MD;  Location: [facility];  Service: General;  Laterality: Left;  Removal left lobe of thyroid   Social History   Social History Narrative   Not on file    There is no immunization history on file for this patient.   Objective: Vital Signs: There were no vitals taken for this visit.   Physical Exam   Musculoskeletal Exam: ***  CDAI Exam: CDAI Score: -- Patient Global: --; Provider Global: -- Swollen: --; Tender: -- Joint Exam 05/01/2021   No joint exam has been documented for this visit   There is currently no information documented on the homunculus. Go to the Rheumatology activity and complete the homunculus joint exam.  Investigation: No additional findings.  Imaging: No results found.  Recent Labs: Lab Results  Component Value Date   WBC 5.2 12/05/2020   HGB 13.7 12/05/2020   PLT 205 12/05/2020   NA 142 12/05/2020   K 4.1 12/05/2020   CL 108 12/05/2020   CO2 25 12/05/2020   GLUCOSE 93 12/05/2020   BUN 15 12/05/2020   CREATININE 0.85 12/05/2020   BILITOT 0.6 12/05/2020   ALKPHOS 35 (L) 02/04/2017   AST 18 12/05/2020   ALT 28 12/05/2020   PROT 6.5 12/05/2020   ALBUMIN 4.1 02/04/2017   CALCIUM 8.9 12/05/2020   GFRAA 115 12/05/2020    Speciality Comments: PLQ EYE Exam: 09/07/18 WNL @  [facility] follow up 1 year  Procedures:  No procedures performed Allergies: Patient has no known allergies.   Assessment / Plan:     Visit  Diagnoses: Seropositive rheumatoid arthritis ([place])  High risk medication use  Primary osteoarthritis of both hands  Primary osteoarthritis of both feet  Flexion contracture of left elbow  Flexion contracture of right elbow  Chondromalacia of both patellae  History of goiter  Urinary retention  Dyslipidemia  History of neutropenia  Orders: No orders of the defined types were placed in this encounter.  No orders of the defined types were placed in this encounter.   Face-to-face time spent with patient was *** minutes. Greater than  50% of time was spent in counseling and coordination of care.  Follow-Up Instructions: No follow-ups on file.   Ofilia Neas, PA-C  Note - This record has been created using Dragon software.  Chart creation errors have been sought, but may not always  have been located. Such creation errors do not reflect on  the standard of medical care.

## 2021-04-20 ENCOUNTER — Other Ambulatory Visit: Payer: Self-pay | Admitting: Physician Assistant

## 2021-04-20 DIAGNOSIS — Z79899 Other long term (current) drug therapy: Secondary | ICD-10-CM

## 2021-04-20 NOTE — Telephone Encounter (Signed)
Okay to give methotrexate 30-day supply.  He needs to come in to get labs before any further refills.

## 2021-04-20 NOTE — Telephone Encounter (Signed)
Next Visit: 05/01/2021   Last Visit: 12/05/2020   Last Fill: 12/30/2020  DX:  Seropositive rheumatoid arthritis    Current Dose per office note 12/05/2020, Methotrexate 7 tablets po once weekly    Labs: 12/05/2020, CBC and CMP WNL  Left message to advise patient he is due for labs.    Okay to refill MTX?

## 2021-04-21 ENCOUNTER — Other Ambulatory Visit: Payer: Self-pay | Admitting: Urology

## 2021-04-21 DIAGNOSIS — R972 Elevated prostate specific antigen [PSA]: Secondary | ICD-10-CM

## 2021-05-01 ENCOUNTER — Ambulatory Visit: Payer: BC Managed Care – PPO | Admitting: Rheumatology

## 2021-05-01 ENCOUNTER — Other Ambulatory Visit: Payer: Self-pay

## 2021-05-01 DIAGNOSIS — M2241 Chondromalacia patellae, right knee: Secondary | ICD-10-CM

## 2021-05-01 DIAGNOSIS — M19041 Primary osteoarthritis, right hand: Secondary | ICD-10-CM

## 2021-05-01 DIAGNOSIS — Z79899 Other long term (current) drug therapy: Secondary | ICD-10-CM

## 2021-05-01 DIAGNOSIS — M24522 Contracture, left elbow: Secondary | ICD-10-CM

## 2021-05-01 DIAGNOSIS — Z862 Personal history of diseases of the blood and blood-forming organs and certain disorders involving the immune mechanism: Secondary | ICD-10-CM

## 2021-05-01 DIAGNOSIS — M24521 Contracture, right elbow: Secondary | ICD-10-CM

## 2021-05-01 DIAGNOSIS — R339 Retention of urine, unspecified: Secondary | ICD-10-CM

## 2021-05-01 DIAGNOSIS — E785 Hyperlipidemia, unspecified: Secondary | ICD-10-CM

## 2021-05-01 DIAGNOSIS — Z8639 Personal history of other endocrine, nutritional and metabolic disease: Secondary | ICD-10-CM

## 2021-05-01 DIAGNOSIS — M19072 Primary osteoarthritis, left ankle and foot: Secondary | ICD-10-CM

## 2021-05-01 DIAGNOSIS — M059 Rheumatoid arthritis with rheumatoid factor, unspecified: Secondary | ICD-10-CM

## 2021-05-01 LAB — COMPLETE METABOLIC PANEL WITH GFR
AG Ratio: 2.3 (calc) (ref 1.0–2.5)
ALT: 13 U/L (ref 9–46)
AST: 11 U/L (ref 10–35)
Albumin: 4.4 g/dL (ref 3.6–5.1)
Alkaline phosphatase (APISO): 38 U/L (ref 35–144)
BUN: 19 mg/dL (ref 7–25)
CO2: 30 mmol/L (ref 20–32)
Calcium: 9.2 mg/dL (ref 8.6–10.3)
Chloride: 107 mmol/L (ref 98–110)
Creat: 0.93 mg/dL (ref 0.70–1.30)
Globulin: 1.9 g/dL (calc) (ref 1.9–3.7)
Glucose, Bld: 96 mg/dL (ref 65–99)
Potassium: 4.6 mmol/L (ref 3.5–5.3)
Sodium: 142 mmol/L (ref 135–146)
Total Bilirubin: 0.5 mg/dL (ref 0.2–1.2)
Total Protein: 6.3 g/dL (ref 6.1–8.1)
eGFR: 98 mL/min/{1.73_m2} (ref >=60)

## 2021-05-01 LAB — CBC WITH DIFFERENTIAL/PLATELET
Absolute Monocytes: 470 cells/uL (ref 200–950)
Basophils Absolute: 49 cells/uL (ref 0–200)
Basophils Relative: 0.9 %
Eosinophils Absolute: 119 cells/uL (ref 15–500)
Eosinophils Relative: 2.2 %
HCT: 42.4 % (ref 38.5–50.0)
Hemoglobin: 14 g/dL (ref 13.2–17.1)
Lymphs Abs: 1139 cells/uL (ref 850–3900)
MCH: 29.7 pg (ref 27.0–33.0)
MCHC: 33 g/dL (ref 32.0–36.0)
MCV: 89.8 fL (ref 80.0–100.0)
MPV: 11.1 fL (ref 7.5–12.5)
Monocytes Relative: 8.7 %
Neutro Abs: 3623 cells/uL (ref 1500–7800)
Neutrophils Relative %: 67.1 %
Platelets: 200 10*3/uL (ref 140–400)
RBC: 4.72 10*6/uL (ref 4.20–5.80)
RDW: 13.6 % (ref 11.0–15.0)
Total Lymphocyte: 21.1 %
WBC: 5.4 10*3/uL (ref 3.8–10.8)

## 2021-05-03 NOTE — Progress Notes (Signed)
CBC and CMP are normal.

## 2021-05-08 ENCOUNTER — Ambulatory Visit
Admission: RE | Admit: 2021-05-08 | Discharge: 2021-05-08 | Disposition: A | Discharge status: Home / Self Care | Payer: Self-pay | Source: Ambulatory Visit | Attending: Urology | Admitting: Urology

## 2021-05-08 DIAGNOSIS — R972 Elevated prostate specific antigen [PSA]: Secondary | ICD-10-CM

## 2021-05-08 MED ORDER — GADOBENATE DIMEGLUMINE 529 MG/ML IV SOLN
20.0000 mL | Freq: Once | INTRAVENOUS | Status: AC | PRN
  Administered 2021-05-08: 20 mL via INTRAVENOUS

## 2021-05-20 ENCOUNTER — Other Ambulatory Visit: Payer: Self-pay | Admitting: Rheumatology

## 2021-05-20 NOTE — Telephone Encounter (Signed)
Next Visit: 06/05/2021   Last Visit: 12/05/2020   Last Fill: 04/20/2021 (30 day supply)   DX:  Seropositive rheumatoid arthritis    Current Dose per office note 12/05/2020, Methotrexate 7 tablets po once weekly    Labs: 05/01/2021 CBC and CMP WNL     Okay to refill MTX?

## 2021-05-22 NOTE — Progress Notes (Signed)
Office Visit Note  Patient: Christian Gentry             Date of Birth: 1965/12/22           MRN: [id number]             PCP: Christain Sacramento, MD Referring: Christain Sacramento, MD Visit Date: 06/05/2021 Occupation: '@GUAROCC'$ @  Subjective:  Medication management   History of Present Illness: Christian Gentry is a 55 y.o. male with a history of seropositive rheumatoid arthritis and osteoarthritis.  He states he has been doing well on methotrexate 7 tablets p.o. weekly.  He denies any joint pain or joint swelling.  He states that he was found to have enlarged prostate.  He had MRI of his prostate.  The results are pending.  Activities of Daily Living:  Patient reports morning stiffness for 0 minutes.   Patient Denies nocturnal pain.  Difficulty dressing/grooming: Denies Difficulty climbing stairs: Denies Difficulty getting out of chair: Denies Difficulty using hands for taps, buttons, cutlery, and/or writing: Denies  Review of Systems  Constitutional:  Negative for fatigue.  HENT:  Negative for mouth sores, mouth dryness and nose dryness.   Eyes:  Positive for dryness. Negative for pain and itching.  Respiratory:  Negative for shortness of breath and difficulty breathing.   Cardiovascular:  Negative for chest pain and palpitations.  Gastrointestinal:  Negative for blood in stool, constipation and diarrhea.  Endocrine: Negative for increased urination.  Genitourinary:  Negative for difficulty urinating.  Musculoskeletal:  Negative for joint pain, joint pain, joint swelling, myalgias, morning stiffness, muscle tenderness and myalgias.  Skin:  Negative for color change, rash and redness.  Allergic/Immunologic: Negative for susceptible to infections.  Neurological:  Positive for dizziness. Negative for numbness, headaches, memory loss and weakness.  Hematological:  Negative for bruising/bleeding tendency.  Psychiatric/Behavioral:  Negative for confusion.    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   Plantar fasciitis 09/03/2016   Dyslipidemia 09/03/2016   Goiter 09/03/2016   Bilateral hearing loss 09/03/2016   History of neutropenia 09/03/2016    Past Medical History:  Diagnosis Date   Hearing difficulty    Hypercholesteremia    Rash    Rheumatoid arthritis ([place])    Thyroid disease     Family History  Problem Relation Age of Onset   Cancer Father        prostate,lung   Cancer Other    Seizures Other    Thyroid disease Other    Past Surgical History:  Procedure Laterality Date   THYROID LOBECTOMY  10/21/11   THYROID LOBECTOMY  10/21/2011   Procedure: THYROID LOBECTOMY;  Surgeon: Haywood Lasso, MD;  Location: [facility];  Service: General;  Laterality: Left;  Removal left lobe of thyroid   Social History   Social History Narrative   Not on file    There is no immunization history on file for this patient.   Objective: Vital Signs: BP 132/90 (BP Location: Left Arm, Patient Position: Sitting, Cuff Size: Normal)   Pulse 61   Ht '6\' 2"'$  (1.88 m)   Wt 217 lb 12.8 oz (98.8 kg)  BMI 27.96 kg/m    Physical Exam Vitals and nursing note reviewed.  Constitutional:      Appearance: He is well-developed.  HENT:     Head: Normocephalic and atraumatic.  Eyes:     Conjunctiva/sclera: Conjunctivae normal.     Pupils: Pupils are equal, round, and reactive to light.  Cardiovascular:     Rate and Rhythm: Normal rate and regular rhythm.     Heart sounds: Normal heart sounds.  Pulmonary:     Effort: Pulmonary effort is normal.     Breath sounds: Normal breath sounds.  Abdominal:     General: Bowel sounds are  normal.     Palpations: Abdomen is soft.  Musculoskeletal:     Cervical back: Normal range of motion and neck supple.  Skin:    General: Skin is warm and dry.     Capillary Refill: Capillary refill takes less than 2 seconds.  Neurological:     Mental Status: He is alert and oriented to person, place, and time.  Psychiatric:        Behavior: Behavior normal.     Musculoskeletal Exam: C-spine was in good range of motion.  Shoulder joints, wrist joints, MCPs PIPs and DIPs with good range of motion.  He has bilateral elbow joint contractures with no synovitis.  Hip joints, knee joints, ankles, MTPs and PIPs with good range of motion with no synovitis.  CDAI Exam: CDAI Score: -- Patient Global: --; Provider Global: -- Swollen: --; Tender: -- Joint Exam 06/05/2021   No joint exam has been documented for this visit   There is currently no information documented on the homunculus. Go to the Rheumatology activity and complete the homunculus joint exam.  Investigation: No additional findings.  Imaging: MR PROSTATE W WO CONTRAST  Result Date: 05/11/2021 CLINICAL DATA:  Elevated PSA level of 8.54. EXAM: MR PROSTATE WITHOUT AND WITH CONTRAST TECHNIQUE: Multiplanar multisequence MRI images were obtained of the pelvis centered about the prostate. Pre and post contrast images were obtained. CONTRAST:  57m MULTIHANCE GADOBENATE DIMEGLUMINE 529 MG/ML IV SOLN COMPARISON:  None. FINDINGS: Prostate: Encapsulated nodularity in the transition zone compatible with benign prostatic hypertrophy. Region of interest tactic 1: PI-RADS category 3 lesion of the left anterior and left posterolateral peripheral zone at the apex with reduced T2 signal but no focal early enhancement or significant restricted diffusion. This measures 0.89 cc (1.9 by 0.6 by 1.3 cm) and is shown for example on image 18 series 9. Region of interest # 2: PI-RADS category 3 lesion of the right anterior and right posterolateral peripheral  zone at the apex, with reduced T2 signal but no early enhancement or restricted diffusion. This measures 0.89 cc (2.0 by 0.8 by 1.2 cm) and is shown for example on image 57 series 10. Volume: 3D volumetric analysis: Prostate volume 61.77 cc (5.6 by 4.7 by 5.6 cm). Transcapsular spread:  Absent Seminal vesicle involvement: Absent Neurovascular bundle involvement: Absent Pelvic adenopathy: Absent Bone metastasis: Absent Other findings: Suspected remote right rectus femoris injury, with low signal foci surrounded by enhancement along the proximal rectus femoris tendon. IMPRESSION: 1. Two PI-RADS category 3 lesions are present in the apical peripheral zone. [place] [facility] 2. Encapsulated nodularity in the transition zone compatible with benign prostatic hypertrophy. 3. Prostatomegaly. 4. Suspected remote right proximal rectus femoris injury. Electronically Signed   By: WVan ClinesM.D.   On: 05/11/2021 08:51    Recent Labs: Lab Results  Component Value Date  WBC 5.4 05/01/2021   HGB 14.0 05/01/2021   PLT 200 05/01/2021   NA 142 05/01/2021   K 4.6 05/01/2021   CL 107 05/01/2021   CO2 30 05/01/2021   GLUCOSE 96 05/01/2021   BUN 19 05/01/2021   CREATININE 0.93 05/01/2021   BILITOT 0.5 05/01/2021   ALKPHOS 35 (L) 02/04/2017   AST 11 05/01/2021   ALT 13 05/01/2021   PROT 6.3 05/01/2021   ALBUMIN 4.1 02/04/2017   CALCIUM 9.2 05/01/2021   GFRAA 115 12/05/2020    Speciality Comments: PLQ EYE Exam: 09/07/18 WNL @  [facility] follow up 1 year  Procedures:  No procedures performed Allergies: Patient has no known allergies.   Assessment / Plan:     Visit Diagnoses: Seropositive rheumatoid arthritis ([place]) - +RF +CCP severe disease with synovitis, U/s exam in 12/2016 revealed only mild synovitis: He has done very well on methotrexate.  He had no synovitis on my exam today.  High risk medication use - Methotrexate 7 tablets po once weekly and folic acid 2 mg by mouth daily.   May 01, 2021 labs were normal.  He has been advised to get labs every 3 months to monitor for drug toxicity.  He was advised to stop methotrexate in case he develops an infection and resume after infection resolves.  An updated information regarding realization was also placed in the AVS.  Primary osteoarthritis of both hands-joint protection muscle strengthening was discussed.  Chondromalacia of both patellae-he denies any knee joint discomfort.  Primary osteoarthritis of both feet-he denies any discomfort in his feet.  Flexion contracture of left elbow - Unchanged  Flexion contracture of right elbow - Unchanged  Urinary retention  Enlarged prostate-patient had recent MRI.  History of goiter  Orders: No orders of the defined types were placed in this encounter.  No orders of the defined types were placed in this encounter.    Follow-Up Instructions: Return in about 5 months (around 11/05/2021) for Rheumatoid arthritis.   Bo Merino, MD  Note - This record has been created using Editor, commissioning.  Chart creation errors have been sought, but may not always  have been located. Such creation errors do not reflect on  the standard of medical care.

## 2021-06-05 ENCOUNTER — Ambulatory Visit: Payer: BC Managed Care – PPO | Admitting: Rheumatology

## 2021-06-05 ENCOUNTER — Encounter: Payer: Self-pay | Admitting: Rheumatology

## 2021-06-05 ENCOUNTER — Other Ambulatory Visit: Payer: Self-pay

## 2021-06-05 VITALS — BP 132/90 | HR 61 | Ht 74.0 in | Wt 217.8 lb

## 2021-06-05 DIAGNOSIS — Z8639 Personal history of other endocrine, nutritional and metabolic disease: Secondary | ICD-10-CM

## 2021-06-05 DIAGNOSIS — M2242 Chondromalacia patellae, left knee: Secondary | ICD-10-CM

## 2021-06-05 DIAGNOSIS — M19071 Primary osteoarthritis, right ankle and foot: Secondary | ICD-10-CM

## 2021-06-05 DIAGNOSIS — M059 Rheumatoid arthritis with rheumatoid factor, unspecified: Secondary | ICD-10-CM

## 2021-06-05 DIAGNOSIS — M19042 Primary osteoarthritis, left hand: Secondary | ICD-10-CM

## 2021-06-05 DIAGNOSIS — M19041 Primary osteoarthritis, right hand: Secondary | ICD-10-CM | POA: Diagnosis not present

## 2021-06-05 DIAGNOSIS — M24522 Contracture, left elbow: Secondary | ICD-10-CM

## 2021-06-05 DIAGNOSIS — Z79899 Other long term (current) drug therapy: Secondary | ICD-10-CM

## 2021-06-05 DIAGNOSIS — M19072 Primary osteoarthritis, left ankle and foot: Secondary | ICD-10-CM

## 2021-06-05 DIAGNOSIS — M2241 Chondromalacia patellae, right knee: Secondary | ICD-10-CM | POA: Diagnosis not present

## 2021-06-05 DIAGNOSIS — R339 Retention of urine, unspecified: Secondary | ICD-10-CM

## 2021-06-05 DIAGNOSIS — M24521 Contracture, right elbow: Secondary | ICD-10-CM

## 2021-06-05 NOTE — Patient Instructions (Signed)
Standing Labs We placed an order today for your standing lab work.   Please have your standing labs drawn in November and every 3 months  If possible, please have your labs drawn 2 weeks prior to your appointment so that the provider can discuss your results at your appointment.  Please note that you may see your imaging and lab results in [place] before we have reviewed them. We may be awaiting multiple results to interpret others before contacting you. Please allow our office up to 72 hours to thoroughly review all of the results before contacting the office for clarification of your results.  We have open lab daily: Monday through Thursday from 1:30-4:30 PM and Friday from 1:30-4:00 PM at the office of Dr. Bo Merino, [facility].   Please be advised, all patients with office appointments requiring lab work will take precedent over walk-in lab work.  If possible, please come for your lab work on Monday and Friday afternoons, as you may experience shorter wait times. The office is located at [address] [place], [place], [place] [address] No appointment is necessary.   Labs are drawn by [facility]. Please bring your co-pay at the time of your lab draw.  You may receive a bill from [facility] for your lab work.  If you wish to have your labs drawn at another location, please call the office 24 hours in advance to send orders.  If you have any questions regarding directions or hours of operation,  please call [phone].   As a reminder, please drink plenty of water prior to coming for your lab work. Thanks!   Vaccines You are taking a medication(s) that can suppress your immune system.  The following immunizations are recommended: Flu annually Covid-19  Td/Tdap (tetanus, diphtheria, pertussis) every 10 years Pneumonia (Prevnar 15 then Pneumovax 23 at least 1 year apart.  Alternatively, can take Prevnar 20 without needing additional dose) Shingrix: 2 doses from 4  weeks to 6 months apart  Please check with your PCP to make sure you are up to date.  If you test POSITIVE for COVID19 and have MILD to MODERATE symptoms: First, call your PCP if you would like to receive COVID19 treatment AND Hold your medications during the infection and for at least 1 week after your symptoms have resolved: Injectable medication (Benlysta, Cimzia, Cosentyx, Enbrel, Humira, Orencia, Remicade, Simponi, Stelara, Taltz, Tremfya) Methotrexate Leflunomide (Arava) Azathioprine Mycophenolate (Cellcept) Roma Kayser, or Rinvoq Otezla If you take Actemra or Kevzara, you DO NOT need to hold these for COVID19 infection.  If you test POSITIVE for COVID19 and have NO symptoms: First, call your PCP if you would like to receive COVID19 treatment AND Hold your medications for at least 10 days after the day that you tested positive Injectable medication (Benlysta, Cimzia, Cosentyx, Enbrel, Humira, Orencia, Remicade, Simponi, Stelara, Taltz, Tremfya) Methotrexate Leflunomide (Arava) Azathioprine Mycophenolate (Cellcept) Roma Kayser, or Rinvoq Otezla If you take Actemra or Kevzara, you DO NOT need to hold these for COVID19 infection.  If you have signs or symptoms of an infection or start antibiotics: First, call your PCP for workup of your infection. Hold your medication through the infection, until you complete your antibiotics, and until symptoms resolve if you take the following: Injectable medication (Actemra, Benlysta, Cimzia, Cosentyx, Enbrel, Humira, Kevzara, Orencia, Remicade, Simponi, Stelara, Taltz, Tremfya) Methotrexate Leflunomide (Arava) Mycophenolate (Cellcept) Morrie Sheldon, [place], or Rinvoq  Heart Disease Prevention   Your inflammatory disease increases your risk of heart disease which includes  heart attack, stroke, atrial fibrillation (irregular heartbeats), high blood pressure, heart failure and atherosclerosis (plaque in the arteries).  It is important  to reduce your risk by:   Keep blood pressure, cholesterol, and blood sugar at healthy levels   Smoking Cessation   Maintain a healthy weight  BMI 20-25   Eat a healthy diet  Plenty of fresh fruit, vegetables, and whole grains  Limit saturated fats, foods high in sodium, and added sugars  DASH and Mediterranean diet   Increase physical activity  Recommend moderate physically activity for 150 minutes per week/ 30 minutes a day for five days a week These can be broken up into three separate ten-minute sessions during the day.   Reduce Stress  Meditation, slow breathing exercises, yoga, coloring books  Dental visits twice a year

## 2021-06-13 HISTORY — PX: PROSTATE BIOPSY: SHX241

## 2021-08-13 ENCOUNTER — Other Ambulatory Visit: Payer: Self-pay | Admitting: Physician Assistant

## 2021-08-13 NOTE — Telephone Encounter (Signed)
Next Visit: 10/30/2021  Last Visit: 06/05/2021  Last Fill: 05/20/2021  DX: Seropositive rheumatoid arthritis   Current Dose per office note 06/05/2021: Methotrexate 7 tablets po once weekly   Labs: 05/01/2021 CBC and CMP are normal.  Left message to advise patient he is due to update labs.   Okay to refill MTX?

## 2021-09-03 ENCOUNTER — Other Ambulatory Visit: Payer: Self-pay | Admitting: *Deleted

## 2021-09-03 DIAGNOSIS — Z79899 Other long term (current) drug therapy: Secondary | ICD-10-CM

## 2021-09-03 LAB — COMPLETE METABOLIC PANEL WITH GFR
AG Ratio: 1.8 (calc) (ref 1.0–2.5)
ALT: 23 U/L (ref 9–46)
AST: 19 U/L (ref 10–35)
Albumin: 4.5 g/dL (ref 3.6–5.1)
Alkaline phosphatase (APISO): 41 U/L (ref 35–144)
BUN: 19 mg/dL (ref 7–25)
CO2: 28 mmol/L (ref 20–32)
Calcium: 9.1 mg/dL (ref 8.6–10.3)
Chloride: 106 mmol/L (ref 98–110)
Creat: 0.99 mg/dL (ref 0.70–1.30)
Globulin: 2.5 g/dL (calc) (ref 1.9–3.7)
Glucose, Bld: 88 mg/dL (ref 65–99)
Potassium: 4.4 mmol/L (ref 3.5–5.3)
Sodium: 141 mmol/L (ref 135–146)
Total Bilirubin: 0.5 mg/dL (ref 0.2–1.2)
Total Protein: 7 g/dL (ref 6.1–8.1)
eGFR: 91 mL/min/{1.73_m2} (ref >=60)

## 2021-09-03 LAB — CBC WITH DIFFERENTIAL/PLATELET
Absolute Monocytes: 512 cells/uL (ref 200–950)
Basophils Absolute: 72 cells/uL (ref 0–200)
Basophils Relative: 1.3 %
Eosinophils Absolute: 171 cells/uL (ref 15–500)
Eosinophils Relative: 3.1 %
HCT: 42.7 % (ref 38.5–50.0)
Hemoglobin: 14.5 g/dL (ref 13.2–17.1)
Lymphs Abs: 1639 cells/uL (ref 850–3900)
MCH: 29.8 pg (ref 27.0–33.0)
MCHC: 34 g/dL (ref 32.0–36.0)
MCV: 87.9 fL (ref 80.0–100.0)
MPV: 11 fL (ref 7.5–12.5)
Monocytes Relative: 9.3 %
Neutro Abs: 3108 cells/uL (ref 1500–7800)
Neutrophils Relative %: 56.5 %
Platelets: 226 10*3/uL (ref 140–400)
RBC: 4.86 10*6/uL (ref 4.20–5.80)
RDW: 13.7 % (ref 11.0–15.0)
Total Lymphocyte: 29.8 %
WBC: 5.5 10*3/uL (ref 3.8–10.8)

## 2021-09-04 NOTE — Progress Notes (Signed)
CBC and CMP are normal.

## 2021-09-10 ENCOUNTER — Other Ambulatory Visit: Payer: Self-pay | Admitting: Physician Assistant

## 2021-09-10 NOTE — Telephone Encounter (Signed)
Next Visit: 10/30/2021   Last Visit: 06/05/2021   Last Fill:08/13/2021 (30 day supply)   DX: Seropositive rheumatoid arthritis    Current Dose per office note 06/05/2021: Methotrexate 7 tablets po once weekly    Labs: 09/03/2021 CBC and CMP are normal.   Okay to refill MTX?

## 2021-10-21 IMAGING — MR MR PROSTATE WO/W CM
12 series · 48 of 48 positions shown · IV contrast (multihance)
Comparison: None.

CLINICAL DATA: Elevated PSA level of 8.54.

EXAM:
MR PROSTATE WITHOUT AND WITH CONTRAST
TECHNIQUE: Multiplanar multisequence MRI images were obtained of the pelvis
centered about the prostate. Pre and post contrast images were
obtained.
CONTRAST:  20mL MULTIHANCE GADOBENATE DIMEGLUMINE 529 MG/ML IV SOLN

[Series 4: T2 · coronal · 3.0mm · 0.56mm/px · 1 of 21 slices shown (1 of 3)]
[im 1/21]
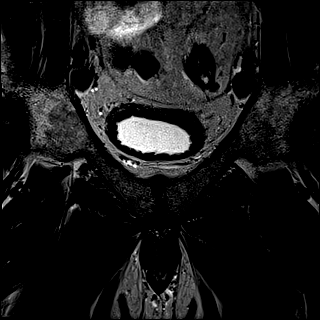

[Series 5: T1 · axial · 5.0mm · 1.25mm/px · z∈[-51,+144]mm · 2 of 80 slices shown]
[im 1/80]
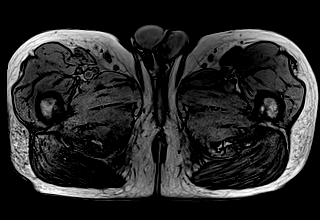
[im 80/80]
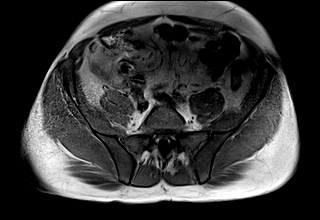

[Series 6: DWI · axial · 3.0mm · 1.75mm/px · z∈[-5,+52]mm · 2 of 60 slices shown (1 of 3)]
[im 1/60]
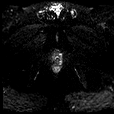
[im 60/60]
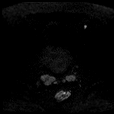

[Series 7: DWI · axial · 3.0mm · 1.75mm/px · 1 of 20 slices shown (2 of 3)]
[im 1/20]
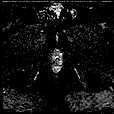

[Series 8: DWI · axial · 3.0mm · 1.75mm/px · 1 of 20 slices shown (3 of 3)]
[im 1/20]
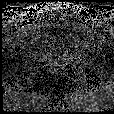

[Series 9: T2 · axial · 3.0mm · 0.56mm/px · 1 of 23 slices shown (2 of 3)]
[im 1/23]
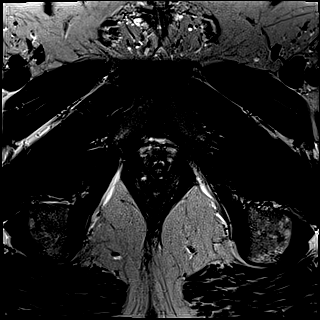

[Series 10: T2 · axial · 1.0mm · 1.04mm/px · z∈[-12,+59]mm · 2 of 72 slices shown (3 of 3)]
[im 1/72]
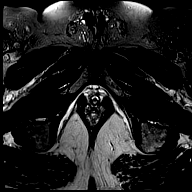
[im 72/72]
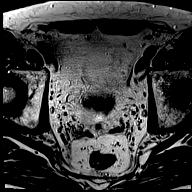

[Series 11: pre t1_twist_tra_dyn · axial · non-contrast · 3.5mm · 0.83mm/px · 1 of 18 slices shown]
[im 1/18]
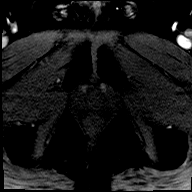

[Series 12: post t1_twist_tra_dyn-copy center · axial · non-contrast · 3.5mm · 0.83mm/px · z∈[-6,+53]mm · 17 of 540 slices shown]
[im 1/540]
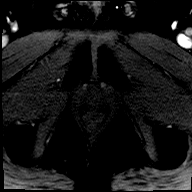
[im 34/540]
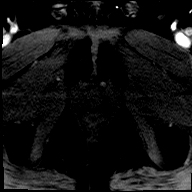
[im 68/540]
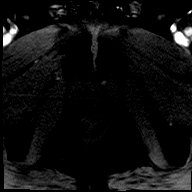
[im 102/540]
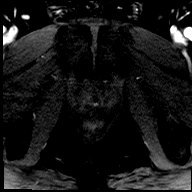
[im 135/540]
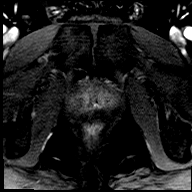
[im 169/540]
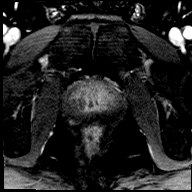
[im 203/540]
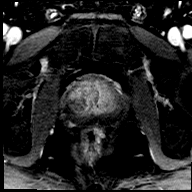
[im 236/540]
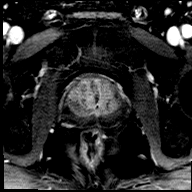
[im 270/540]
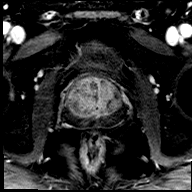
[im 304/540]
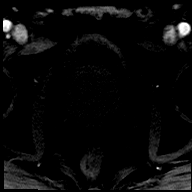
[im 337/540]
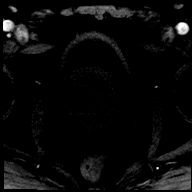
[im 371/540]
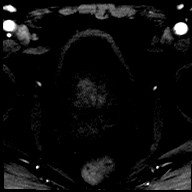
[im 405/540]
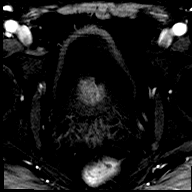
[im 438/540]
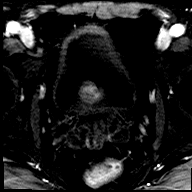
[im 472/540]
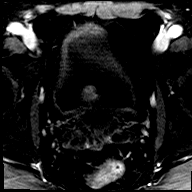
[im 506/540]
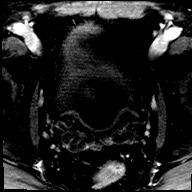
[im 540/540]
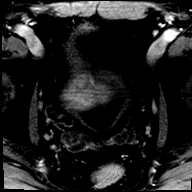

[Series 13: post t1_twist_tra_dyn-copy cent_sub · axial · 3.5mm · 0.83mm/px · z∈[-6,+53]mm · 16 of 522 slices shown]
[im 1/522]
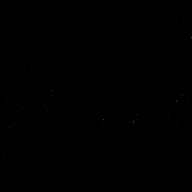
[im 35/522]
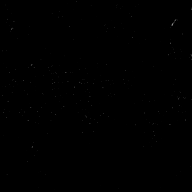
[im 70/522]
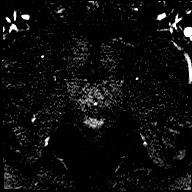
[im 105/522]
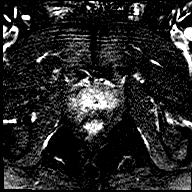
[im 139/522]
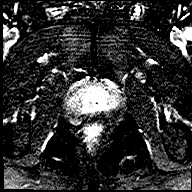
[im 174/522]
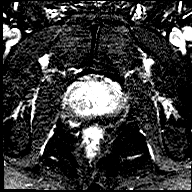
[im 209/522]
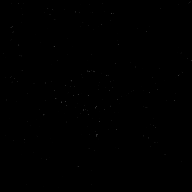
[im 244/522]
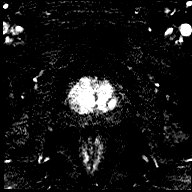
[im 278/522]
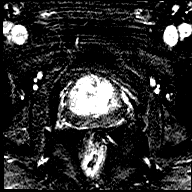
[im 313/522]
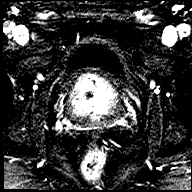
[im 348/522]
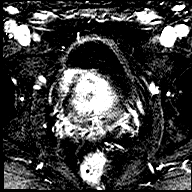
[im 383/522]
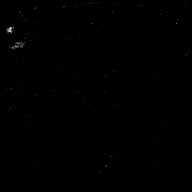
[im 417/522]
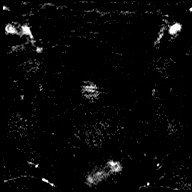
[im 452/522]
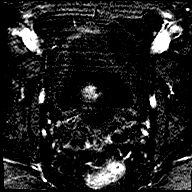
[im 487/522]
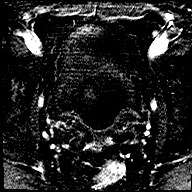
[im 522/522]
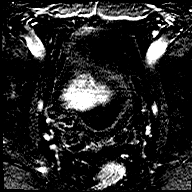

[Series 14: t1_vibe_dixon_tra_f · axial · 2.5mm · 0.91mm/px · z∈[-52,+145]mm · 2 of 80 slices shown]
[im 1/80]
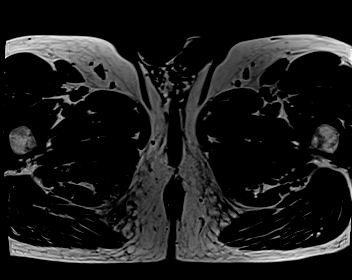
[im 80/80]
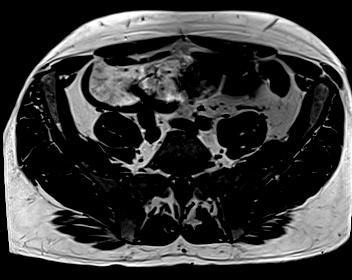

[Series 15: t1_vibe_dixon_tra_w · axial · 2.5mm · 0.91mm/px · z∈[-52,+145]mm · 2 of 80 slices shown]
[im 1/80]
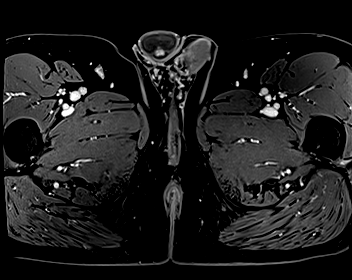
[im 80/80]
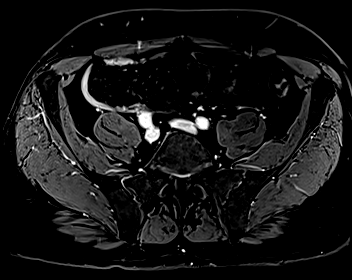

[48 of 48 positions shown; findings below may reference images not displayed]

FINDINGS: Prostate:

Encapsulated nodularity in the transition zone compatible with
benign prostatic hypertrophy.

Region of interest tactic 1: PI-RADS category 3 lesion of the left
anterior and left posterolateral peripheral zone at the apex with
reduced T2 signal but no focal early enhancement or significant
restricted diffusion. This measures 0.89 cc (1.9 by 0.6 by 1.3 cm)
and is shown for example on image 18 series 9.

Region of interest # 2: PI-RADS category 3 lesion of the right
anterior and right posterolateral peripheral zone at the apex, with
reduced T2 signal but no early enhancement or restricted diffusion.
This measures 0.89 cc (2.0 by 0.8 by 1.2 cm) and is shown for
example on image 57 series 10.

Volume: 3D volumetric analysis: Prostate volume 61.77 cc (5.6 by
by 5.6 cm).

Transcapsular spread:  Absent

Seminal vesicle involvement: Absent

Neurovascular bundle involvement: Absent

Pelvic adenopathy: Absent

Bone metastasis: Absent

Other findings: Suspected remote right rectus femoris injury, with
low signal foci surrounded by enhancement along the proximal rectus
femoris tendon.
IMPRESSION: 1. Two PI-RADS category 3 lesions are present in the apical
peripheral zone. Targeting data sent to [facility].
2. Encapsulated nodularity in the transition zone compatible with
benign prostatic hypertrophy.
3. Prostatomegaly.
4. Suspected remote right proximal rectus femoris injury.

## 2021-10-23 NOTE — Progress Notes (Unsigned)
Office Visit Note  Patient: Christian Gentry             Date of Birth: July 29, 1966           MRN: [id number]             PCP: Christain Sacramento, MD Referring: Christain Sacramento, MD Visit Date: 11/06/2021 Occupation: @GUAROCC @  Subjective:  No chief complaint on file.   History of Present Illness: Christian Gentry is a 56 y.o. male ***   Activities of Daily Living:  Patient reports morning stiffness for *** {minute/hour:19697}.   Patient {ACTIONS;DENIES/REPORTS:21021675::"Denies"} nocturnal pain.  Difficulty dressing/grooming: {ACTIONS;DENIES/REPORTS:21021675::"Denies"} Difficulty climbing stairs: {ACTIONS;DENIES/REPORTS:21021675::"Denies"} Difficulty getting out of chair: {ACTIONS;DENIES/REPORTS:21021675::"Denies"} Difficulty using hands for taps, buttons, cutlery, and/or writing: {ACTIONS;DENIES/REPORTS:21021675::"Denies"}  No Rheumatology ROS completed.   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   Plantar fasciitis 09/03/2016   Dyslipidemia 09/03/2016   Goiter 09/03/2016   Bilateral hearing loss 09/03/2016   History of neutropenia 09/03/2016    Past Medical History:  Diagnosis Date   Hearing difficulty    Hypercholesteremia    Rash    Rheumatoid arthritis ([place])    Thyroid disease     Family History  Problem Relation Age of Onset   Cancer Father        prostate,lung   Cancer Other    Seizures Other    Thyroid disease Other    Past Surgical History:  Procedure Laterality Date   THYROID LOBECTOMY  10/21/11   THYROID LOBECTOMY  10/21/2011   Procedure: THYROID LOBECTOMY;  Surgeon: Haywood Lasso, MD;  Location: [facility];  Service: General;  Laterality: Left;  Removal left lobe of thyroid   Social History   Social History Narrative   Not on file    There is no immunization history on file for this patient.   Objective: Vital Signs: There were no vitals taken for this visit.   Physical Exam   Musculoskeletal Exam: ***  CDAI Exam: CDAI Score: -- Patient Global: --; Provider Global: -- Swollen: --; Tender: -- Joint Exam 11/06/2021   No joint exam has been documented for this visit   There is currently no information documented on the homunculus. Go to the Rheumatology activity and complete the homunculus joint exam.  Investigation: No additional findings.  Imaging: No results found.  Recent Labs: Lab Results  Component Value Date   WBC 5.5 09/03/2021   HGB 14.5 09/03/2021   PLT 226 09/03/2021   NA 141 09/03/2021   K 4.4 09/03/2021   CL 106 09/03/2021   CO2 28 09/03/2021   GLUCOSE 88 09/03/2021   BUN 19 09/03/2021   CREATININE 0.99 09/03/2021   BILITOT 0.5 09/03/2021   ALKPHOS 35 (L) 02/04/2017   AST 19 09/03/2021   ALT 23 09/03/2021   PROT 7.0 09/03/2021   ALBUMIN 4.1 02/04/2017   CALCIUM 9.1 09/03/2021   GFRAA 115 12/05/2020    Speciality Comments: PLQ EYE Exam: 09/07/18 WNL @  [facility] follow up 1 year  Procedures:  No procedures performed Allergies: Patient has no known allergies.   Assessment / Plan:     Visit  Diagnoses: No diagnosis found.  Orders: No orders of the defined types were placed in this encounter.  No orders of the defined types were placed in this encounter.   Face-to-face time spent with patient was *** minutes. Greater than 50% of time was spent in counseling and coordination of care.  Follow-Up Instructions: No follow-ups on file.   Earnestine Mealing, CMA  Note - This record has been created using Editor, commissioning.  Chart creation errors have been sought, but may not always  have been located. Such creation errors do not reflect  on  the standard of medical care.

## 2021-10-30 ENCOUNTER — Ambulatory Visit: Payer: BC Managed Care – PPO | Admitting: Rheumatology

## 2021-11-06 ENCOUNTER — Ambulatory Visit: Payer: BC Managed Care – PPO | Admitting: Rheumatology

## 2021-11-06 DIAGNOSIS — M24521 Contracture, right elbow: Secondary | ICD-10-CM

## 2021-11-06 DIAGNOSIS — M24522 Contracture, left elbow: Secondary | ICD-10-CM

## 2021-11-06 DIAGNOSIS — R339 Retention of urine, unspecified: Secondary | ICD-10-CM

## 2021-11-06 DIAGNOSIS — M059 Rheumatoid arthritis with rheumatoid factor, unspecified: Secondary | ICD-10-CM

## 2021-11-06 DIAGNOSIS — M19041 Primary osteoarthritis, right hand: Secondary | ICD-10-CM

## 2021-11-06 DIAGNOSIS — N400 Enlarged prostate without lower urinary tract symptoms: Secondary | ICD-10-CM

## 2021-11-06 DIAGNOSIS — Z8639 Personal history of other endocrine, nutritional and metabolic disease: Secondary | ICD-10-CM

## 2021-11-06 DIAGNOSIS — M2241 Chondromalacia patellae, right knee: Secondary | ICD-10-CM

## 2021-11-06 DIAGNOSIS — M19071 Primary osteoarthritis, right ankle and foot: Secondary | ICD-10-CM

## 2021-11-06 DIAGNOSIS — Z79899 Other long term (current) drug therapy: Secondary | ICD-10-CM

## 2021-11-13 NOTE — Progress Notes (Unsigned)
Office Visit Note  Patient: Christian Gentry             Date of Birth: 04/02/1966           MRN: [id number]             PCP: Christain Sacramento, MD Referring: Christain Sacramento, MD Visit Date: 11/27/2021 Occupation: @GUAROCC @  Subjective:  No chief complaint on file.   History of Present Illness: Christian Gentry is a 56 y.o. male ***   Activities of Daily Living:  Patient reports morning stiffness for *** {minute/hour:19697}.   Patient {ACTIONS;DENIES/REPORTS:21021675::"Denies"} nocturnal pain.  Difficulty dressing/grooming: {ACTIONS;DENIES/REPORTS:21021675::"Denies"} Difficulty climbing stairs: {ACTIONS;DENIES/REPORTS:21021675::"Denies"} Difficulty getting out of chair: {ACTIONS;DENIES/REPORTS:21021675::"Denies"} Difficulty using hands for taps, buttons, cutlery, and/or writing: {ACTIONS;DENIES/REPORTS:21021675::"Denies"}  No Rheumatology ROS completed.   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   Plantar fasciitis 09/03/2016   Dyslipidemia 09/03/2016   Goiter 09/03/2016   Bilateral hearing loss 09/03/2016   History of neutropenia 09/03/2016    Past Medical History:  Diagnosis Date   Hearing difficulty    Hypercholesteremia    Rash    Rheumatoid arthritis ([place])    Thyroid disease     Family History  Problem Relation Age of Onset   Cancer Father        prostate,lung   Cancer Other    Seizures Other    Thyroid disease Other    Past Surgical History:  Procedure Laterality Date   THYROID LOBECTOMY  10/21/11   THYROID LOBECTOMY  10/21/2011   Procedure: THYROID LOBECTOMY;  Surgeon: Haywood Lasso, MD;  Location: [facility];  Service: General;  Laterality: Left;  Removal left lobe of thyroid   Social History   Social History Narrative   Not on file    There is no immunization history on file for this patient.   Objective: Vital Signs: There were no vitals taken for this visit.   Physical Exam   Musculoskeletal Exam: ***  CDAI Exam: CDAI Score: -- Patient Global: --; Provider Global: -- Swollen: --; Tender: -- Joint Exam 11/27/2021   No joint exam has been documented for this visit   There is currently no information documented on the homunculus. Go to the Rheumatology activity and complete the homunculus joint exam.  Investigation: No additional findings.  Imaging: No results found.  Recent Labs: Lab Results  Component Value Date   WBC 5.5 09/03/2021   HGB 14.5 09/03/2021   PLT 226 09/03/2021   NA 141 09/03/2021   K 4.4 09/03/2021   CL 106 09/03/2021   CO2 28 09/03/2021   GLUCOSE 88 09/03/2021   BUN 19 09/03/2021   CREATININE 0.99 09/03/2021   BILITOT 0.5 09/03/2021   ALKPHOS 35 (L) 02/04/2017   AST 19 09/03/2021   ALT 23 09/03/2021   PROT 7.0 09/03/2021   ALBUMIN 4.1 02/04/2017   CALCIUM 9.1 09/03/2021   GFRAA 115 12/05/2020    Speciality Comments: PLQ EYE Exam: 09/07/18 WNL @  [facility] follow up 1 year  Procedures:  No procedures performed Allergies: Patient has no known allergies.   Assessment / Plan:     Visit  Diagnoses: No diagnosis found.  Orders: No orders of the defined types were placed in this encounter.  No orders of the defined types were placed in this encounter.   Face-to-face time spent with patient was *** minutes. Greater than 50% of time was spent in counseling and coordination of care.  Follow-Up Instructions: No follow-ups on file.   Earnestine Mealing, CMA  Note - This record has been created using Editor, commissioning.  Chart creation errors have been sought, but may not always  have been located. Such creation errors do not reflect  on  the standard of medical care.

## 2021-11-27 ENCOUNTER — Encounter: Payer: Self-pay | Admitting: Rheumatology

## 2021-11-27 ENCOUNTER — Other Ambulatory Visit: Payer: Self-pay

## 2021-11-27 ENCOUNTER — Ambulatory Visit: Payer: BC Managed Care – PPO | Admitting: Rheumatology

## 2021-11-27 VITALS — BP 146/90 | HR 71 | Ht 74.0 in | Wt 225.4 lb

## 2021-11-27 DIAGNOSIS — M2241 Chondromalacia patellae, right knee: Secondary | ICD-10-CM

## 2021-11-27 DIAGNOSIS — Z6828 Body mass index (BMI) 28.0-28.9, adult: Secondary | ICD-10-CM

## 2021-11-27 DIAGNOSIS — Z8639 Personal history of other endocrine, nutritional and metabolic disease: Secondary | ICD-10-CM

## 2021-11-27 DIAGNOSIS — N400 Enlarged prostate without lower urinary tract symptoms: Secondary | ICD-10-CM

## 2021-11-27 DIAGNOSIS — M19071 Primary osteoarthritis, right ankle and foot: Secondary | ICD-10-CM

## 2021-11-27 DIAGNOSIS — M19072 Primary osteoarthritis, left ankle and foot: Secondary | ICD-10-CM

## 2021-11-27 DIAGNOSIS — M19041 Primary osteoarthritis, right hand: Secondary | ICD-10-CM

## 2021-11-27 DIAGNOSIS — M24522 Contracture, left elbow: Secondary | ICD-10-CM

## 2021-11-27 DIAGNOSIS — M2242 Chondromalacia patellae, left knee: Secondary | ICD-10-CM

## 2021-11-27 DIAGNOSIS — M19042 Primary osteoarthritis, left hand: Secondary | ICD-10-CM

## 2021-11-27 DIAGNOSIS — M059 Rheumatoid arthritis with rheumatoid factor, unspecified: Secondary | ICD-10-CM

## 2021-11-27 DIAGNOSIS — Z79899 Other long term (current) drug therapy: Secondary | ICD-10-CM

## 2021-11-27 DIAGNOSIS — M24521 Contracture, right elbow: Secondary | ICD-10-CM

## 2021-11-27 DIAGNOSIS — R339 Retention of urine, unspecified: Secondary | ICD-10-CM

## 2021-11-27 MED ORDER — METHOTREXATE 2.5 MG PO TABS: ORAL_TABLET | ORAL | 0 refills | Status: DC

## 2021-11-27 NOTE — Patient Instructions (Addendum)
Standing Labs ?We placed an order today for your standing lab work.  ? ?Please have your standing labs drawn in March and every 3 months ? ?If possible, please have your labs drawn 2 weeks prior to your appointment so that the provider can discuss your results at your appointment. ? ?Please note that you may see your imaging and lab results in [place] before we have reviewed them. ?We may be awaiting multiple results to interpret others before contacting you. ?Please allow our office up to 72 hours to thoroughly review all of the results before contacting the office for clarification of your results. ? ?We have open lab daily: ?Monday through Thursday from 1:30-4:30 PM and Friday from 1:30-4:00 PM ?at the office of Dr. Bo Merino, [facility].   ?Please be advised, all patients with office appointments requiring lab work will take precedent over walk-in lab work.  ?If possible, please come for your lab work on Monday and Friday afternoons, as you may experience shorter wait times. ?The office is located at [address] [place], [place], [place] [address] ?No appointment is necessary.   ?Labs are drawn by [facility]. Please bring your co-pay at the time of your lab draw.  You may receive a bill from [facility] for your lab work. ? ?Please note if you are on Hydroxychloroquine and and an order has been placed for a Hydroxychloroquine level, you will need to have it drawn 4 hours or more after your last dose. ? ?If you wish to have your labs drawn at another location, please call the office 24 hours in advance to send orders. ? ?If you have any questions regarding directions or hours of operation,  ?please call [phone].   ?As a reminder, please drink plenty of water prior to coming for your lab work. Thanks!  ? ?If you have signs or symptoms of an infection or start antibiotics: ?First, call your PCP for workup of your infection. ?Hold your medication through the infection, until you complete your  antibiotics, and until symptoms resolve if you take the following: ?Injectable medication (Actemra, Benlysta, Cimzia, Cosentyx, Enbrel, Humira, Kevzara, Orencia, Remicade, Simponi, [place], [place], [place]) ?Methotrexate ?Leflunomide Jolee Ewing) ?Mycophenolate (Cellcept) ?Roma Kayser, or Rinvoq  ? ?Vaccines ?You are taking a medication(s) that can suppress your immune system.  The following immunizations are recommended: ?Flu annually ?Covid-19  ?Td/Tdap (tetanus, diphtheria, pertussis) every 10 years ?Pneumonia (Prevnar 15 then Pneumovax 23 at least 1 year apart.  Alternatively, can take Prevnar 20 without needing additional dose) ?Shingrix: 2 doses from 4 weeks to 6 months apart ? ?Please check with your PCP to make sure you are up to date.  ?

## 2021-12-11 ENCOUNTER — Other Ambulatory Visit: Payer: Self-pay | Admitting: *Deleted

## 2021-12-11 DIAGNOSIS — Z79899 Other long term (current) drug therapy: Secondary | ICD-10-CM

## 2021-12-12 LAB — CBC WITH DIFFERENTIAL/PLATELET
Absolute Monocytes: 566 cells/uL (ref 200–950)
Basophils Absolute: 52 cells/uL (ref 0–200)
Basophils Relative: 0.8 %
Eosinophils Absolute: 130 cells/uL (ref 15–500)
Eosinophils Relative: 2 %
HCT: 40.8 % (ref 38.5–50.0)
Hemoglobin: 13.8 g/dL (ref 13.2–17.1)
Lymphs Abs: 1489 cells/uL (ref 850–3900)
MCH: 29.8 pg (ref 27.0–33.0)
MCHC: 33.8 g/dL (ref 32.0–36.0)
MCV: 88.1 fL (ref 80.0–100.0)
MPV: 11.4 fL (ref 7.5–12.5)
Monocytes Relative: 8.7 %
Neutro Abs: 4264 cells/uL (ref 1500–7800)
Neutrophils Relative %: 65.6 %
Platelets: 217 10*3/uL (ref 140–400)
RBC: 4.63 10*6/uL (ref 4.20–5.80)
RDW: 14.3 % (ref 11.0–15.0)
Total Lymphocyte: 22.9 %
WBC: 6.5 10*3/uL (ref 3.8–10.8)

## 2021-12-12 LAB — COMPLETE METABOLIC PANEL WITH GFR
AG Ratio: 1.7 (calc) (ref 1.0–2.5)
ALT: 16 U/L (ref 9–46)
AST: 15 U/L (ref 10–35)
Albumin: 4.3 g/dL (ref 3.6–5.1)
Alkaline phosphatase (APISO): 44 U/L (ref 35–144)
BUN: 17 mg/dL (ref 7–25)
CO2: 28 mmol/L (ref 20–32)
Calcium: 9 mg/dL (ref 8.6–10.3)
Chloride: 106 mmol/L (ref 98–110)
Creat: 0.9 mg/dL (ref 0.70–1.30)
Globulin: 2.5 g/dL (calc) (ref 1.9–3.7)
Glucose, Bld: 109 mg/dL — ABNORMAL HIGH (ref 65–99)
Potassium: 4.4 mmol/L (ref 3.5–5.3)
Sodium: 142 mmol/L (ref 135–146)
Total Bilirubin: 0.4 mg/dL (ref 0.2–1.2)
Total Protein: 6.8 g/dL (ref 6.1–8.1)
eGFR: 101 mL/min/{1.73_m2} (ref >=60)

## 2021-12-13 NOTE — Progress Notes (Signed)
CBC and CMP are normal.

## 2022-02-21 ENCOUNTER — Other Ambulatory Visit: Payer: Self-pay | Admitting: Rheumatology

## 2022-02-22 NOTE — Telephone Encounter (Signed)
Next Visit: 04/30/2022  Last Visit: 11/27/2021  Last Fill: 11/27/2021  DX: Seropositive rheumatoid arthritis   Current Dose per office note 11/27/2021: Methotrexate 7 tablets po once weekly   Labs: 12/11/2021 CBC and CMP are normal.  Okay to refill MTX?

## 2022-03-05 ENCOUNTER — Telehealth: Payer: Self-pay | Admitting: *Deleted

## 2022-04-16 NOTE — Progress Notes (Signed)
Office Visit Note  Patient: Christian Gentry             Date of Birth: [birth year]           MRN: [id number]             PCP: Christain Sacramento, MD Referring: Christain Sacramento, MD Visit Date: 04/30/2022 Occupation: '@GUAROCC'$ @  Subjective:  Medication management  History of Present Illness: Christian Sim Choquette is a 56 y.o. male with history of seropositive rheumatoid arthritis and osteoarthritis.  He has been on methotrexate 6 tablets p.o. weekly since his last visit in March.  He has not noticed any increased joint pain or joint swelling.  He has been tolerating medications well.  Activities of Daily Living:  Patient reports morning stiffness for 0 minutes.   Patient Denies nocturnal pain.  Difficulty dressing/grooming: Denies Difficulty climbing stairs: Denies Difficulty getting out of chair: Denies Difficulty using hands for taps, buttons, cutlery, and/or writing: Denies  Review of Systems  Constitutional:  Negative for fatigue.  HENT:  Negative for mouth sores and mouth dryness.   Eyes:  Positive for dryness.  Respiratory:  Negative for shortness of breath.   Cardiovascular:  Negative for chest pain and palpitations.  Gastrointestinal:  Negative for blood in stool, constipation and diarrhea.  Endocrine: Negative for increased urination.  Genitourinary:  Positive for difficulty urinating. Negative for involuntary urination.  Musculoskeletal:  Positive for myalgias, muscle tenderness and myalgias. Negative for joint pain, gait problem, joint pain, joint swelling, muscle weakness and morning stiffness.  Skin:  Negative for color change, rash, hair loss and sensitivity to sunlight.  Allergic/Immunologic: Negative for susceptible to infections.  Neurological:  Negative for dizziness and headaches.  Hematological:  Negative for swollen glands.  Psychiatric/Behavioral:  Negative for depressed mood and sleep disturbance. The patient is not nervous/anxious.     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   Plantar fasciitis 09/03/2016   Dyslipidemia 09/03/2016   Goiter 09/03/2016   Bilateral hearing loss 09/03/2016   History of neutropenia 09/03/2016    Past Medical History:  Diagnosis Date   Hearing difficulty    Hypercholesteremia    Rash    Rheumatoid arthritis ([place])    Thyroid disease     Family History  Problem Relation Age of Onset   Cancer Father        prostate,lung   Cancer Other    Seizures Other    Thyroid disease Other    Past Surgical History:  Procedure Laterality Date   PROSTATE BIOPSY  06/2021   THYROID LOBECTOMY  10/21/2011   THYROID LOBECTOMY  10/21/2011   Procedure: THYROID LOBECTOMY;  Surgeon: Haywood Lasso, MD;  Location: [facility];  Service: General;  Laterality: Left;  Removal left lobe of thyroid   Social History   Social History Narrative   Not on file    There is no immunization history on file for this patient.   Objective: Vital Signs: BP 135/85 (BP Location: Left Arm, Patient Position: Sitting, Cuff Size: Normal)   Pulse 65   Resp 18   Ht '6\' 2"'$  (1.88 m)   Wt 218  lb 6.4 oz (99.1 kg)   BMI 28.04 kg/m    Physical Exam Vitals and nursing note reviewed.  Constitutional:      Appearance: He is well-developed.  HENT:     Head: Normocephalic and atraumatic.  Eyes:     Conjunctiva/sclera: Conjunctivae normal.     Pupils: Pupils are equal, round, and reactive to light.  Cardiovascular:     Rate and Rhythm: Normal rate and regular rhythm.     Heart sounds: Normal heart sounds.  Pulmonary:     Effort: Pulmonary effort is normal.     Breath sounds: Normal breath sounds.   Abdominal:     General: Bowel sounds are normal.     Palpations: Abdomen is soft.  Musculoskeletal:     Cervical back: Normal range of motion and neck supple.  Skin:    General: Skin is warm and dry.     Capillary Refill: Capillary refill takes less than 2 seconds.  Neurological:     Mental Status: He is alert and oriented to person, place, and time.  Psychiatric:        Behavior: Behavior normal.      Musculoskeletal Exam: C-spine thoracic and lumbar spine were in good range of motion.  Shoulder joints, wrist joints, MCPs PIPs and DIPs with good range of motion.  Bilateral elbow joint contractures are unchanged.  He had no synovitis over elbow joints.  Hips, knees, ankles and MTPs and PIPs with good range of motion with no synovitis.  He had callus formation under the metatarsals.  CDAI Exam: CDAI Score: 0.2  Patient Global: 1 mm; Provider Global: 1 mm Swollen: 0 ; Tender: 0  Joint Exam 04/30/2022   No joint exam has been documented for this visit   There is currently no information documented on the homunculus. Go to the Rheumatology activity and complete the homunculus joint exam.  Investigation: No additional findings.  Imaging: No results found.  Recent Labs: Lab Results  Component Value Date   WBC 6.5 12/11/2021   HGB 13.8 12/11/2021   PLT 217 12/11/2021   NA 142 12/11/2021   K 4.4 12/11/2021   CL 106 12/11/2021   CO2 28 12/11/2021   GLUCOSE 109 (H) 12/11/2021   BUN 17 12/11/2021   CREATININE 0.90 12/11/2021   BILITOT 0.4 12/11/2021   ALKPHOS 35 (L) 02/04/2017   AST 15 12/11/2021   ALT 16 12/11/2021   PROT 6.8 12/11/2021   ALBUMIN 4.1 02/04/2017   CALCIUM 9.0 12/11/2021   GFRAA 115 12/05/2020    Speciality Comments: PLQ EYE Exam: 09/07/18 WNL @  [facility] follow up 1 year  Procedures:  No procedures performed Allergies: Patient has no known allergies.   Assessment / Plan:     Visit Diagnoses: Seropositive rheumatoid arthritis ([place]) - +RF +CCP  severe disease with synovitis, U/s exam in 12/2016 revealed only mild synovitis: He has done well over the years.  He states asymptomatic on methotrexate.  He reduce the dose of methotrexate to 6 tablets p.o. weekly in March which she is tolerating well without any flare of disease.  High risk medication use - Methotrexate 6 tablets po once weekly and folic acid 2 mg by mouth daily.  -Labs obtained on December 11, 2021 were reviewed which were within normal limits.  He was advised to get labs every 3 months to monitor for drug toxicity.  We will get labs today.  Plan: CBC with Differential/Platelet, COMPLETE METABOLIC PANEL WITH GFR.  Information  on immunization was placed in the AVS.  He was also advised to hold methotrexate if he develops an infection.    Primary osteoarthritis of both hands-he had some PIP and DIP thickening but no synovitis was noted.  Joint protection muscle strengthening was discussed.  Chondromalacia of both patellae-really asymptomatic.  Primary osteoarthritis of both feet-he has PIP DIP thickening in his feet but no synovitis was noted.  Flexion contracture of left elbow - Unchanged  Flexion contracture of right elbow - Unchanged  Callus under metatarsal head-calluses was noted under metatarsal pads.  Shoes with arch support were advised.  Enlarged prostate - patient had recent MRI.  Biopsy was negative.  He is followed by urology for elevated PSA.  History of goiter  BMI 28.0-28.9,adult  Orders: Orders Placed This Encounter  Procedures   CBC with Differential/Platelet   COMPLETE METABOLIC PANEL WITH GFR   No orders of the defined types were placed in this encounter.   Follow-Up Instructions: Return in about 5 months (around 09/30/2022) for Rheumatoid arthritis.   Bo Merino, MD  Note - This record has been created using Editor, commissioning.  Chart creation errors have been sought, but may not always  have been located. Such creation errors do not reflect  on  the standard of medical care.

## 2022-04-30 ENCOUNTER — Encounter: Payer: Self-pay | Admitting: Rheumatology

## 2022-04-30 ENCOUNTER — Ambulatory Visit: Payer: BC Managed Care – PPO | Attending: Rheumatology | Admitting: Rheumatology

## 2022-04-30 VITALS — BP 135/85 | HR 65 | Resp 18 | Ht 74.0 in | Wt 218.4 lb

## 2022-04-30 DIAGNOSIS — N400 Enlarged prostate without lower urinary tract symptoms: Secondary | ICD-10-CM

## 2022-04-30 DIAGNOSIS — Z8639 Personal history of other endocrine, nutritional and metabolic disease: Secondary | ICD-10-CM

## 2022-04-30 DIAGNOSIS — M2241 Chondromalacia patellae, right knee: Secondary | ICD-10-CM

## 2022-04-30 DIAGNOSIS — M059 Rheumatoid arthritis with rheumatoid factor, unspecified: Secondary | ICD-10-CM | POA: Diagnosis not present

## 2022-04-30 DIAGNOSIS — M19041 Primary osteoarthritis, right hand: Secondary | ICD-10-CM | POA: Diagnosis not present

## 2022-04-30 DIAGNOSIS — R339 Retention of urine, unspecified: Secondary | ICD-10-CM

## 2022-04-30 DIAGNOSIS — L84 Corns and callosities: Secondary | ICD-10-CM

## 2022-04-30 DIAGNOSIS — M24521 Contracture, right elbow: Secondary | ICD-10-CM

## 2022-04-30 DIAGNOSIS — M19072 Primary osteoarthritis, left ankle and foot: Secondary | ICD-10-CM

## 2022-04-30 DIAGNOSIS — M19042 Primary osteoarthritis, left hand: Secondary | ICD-10-CM

## 2022-04-30 DIAGNOSIS — M24522 Contracture, left elbow: Secondary | ICD-10-CM

## 2022-04-30 DIAGNOSIS — M19071 Primary osteoarthritis, right ankle and foot: Secondary | ICD-10-CM

## 2022-04-30 DIAGNOSIS — Z79899 Other long term (current) drug therapy: Secondary | ICD-10-CM

## 2022-04-30 DIAGNOSIS — Z6828 Body mass index (BMI) 28.0-28.9, adult: Secondary | ICD-10-CM

## 2022-04-30 DIAGNOSIS — M2242 Chondromalacia patellae, left knee: Secondary | ICD-10-CM

## 2022-04-30 NOTE — Patient Instructions (Signed)
Standing Labs We placed an order today for your standing lab work.   Please have your standing labs drawn in November and every 3 months  If possible, please have your labs drawn 2 weeks prior to your appointment so that the provider can discuss your results at your appointment.  Please note that you may see your imaging and lab results in [place] before we have reviewed them. We may be awaiting multiple results to interpret others before contacting you. Please allow our office up to 72 hours to thoroughly review all of the results before contacting the office for clarification of your results.  We currently have open lab daily: Monday through Thursday from 1:30 PM-4:30 PM and Friday from 1:30 PM- 4:00 PM If possible, please come for your lab work on Monday, Thursday or Friday afternoons, as you may experience shorter wait times.   Effective July 14, 2022 the new lab hours will change to: Monday through Thursday from 1:30 PM-5:00 PM and Friday from 8:30 AM-12:00 PM If possible, please come for your lab work on Monday and Thursday afternoons, as you may experience shorter wait times.  Please be advised, all patients with office appointments requiring lab work will take precedent over walk-in lab work.    The office is located at [address], [place], [place], [place] [address] No appointment is necessary.   Labs are drawn by [facility]. Please bring your co-pay at the time of your lab draw.  You may receive a bill from [facility] for your lab work.  Please note if you are on Hydroxychloroquine and and an order has been placed for a Hydroxychloroquine level, you will need to have it drawn 4 hours or more after your last dose.  If you wish to have your labs drawn at another location, please call the office 24 hours in advance to send orders.  If you have any questions regarding directions or hours of operation,  please call [phone].   As a reminder, please drink plenty of water prior  to coming for your lab work. Thanks!   Vaccines You are taking a medication(s) that can suppress your immune system.  The following immunizations are recommended: Flu annually Covid-19  Td/Tdap (tetanus, diphtheria, pertussis) every 10 years Pneumonia (Prevnar 15 then Pneumovax 23 at least 1 year apart.  Alternatively, can take Prevnar 20 without needing additional dose) Shingrix: 2 doses from 4 weeks to 6 months apart  Please check with your PCP to make sure you are up to date.   If you have signs or symptoms of an infection or start antibiotics: First, call your PCP for workup of your infection. Hold your medication through the infection, until you complete your antibiotics, and until symptoms resolve if you take the following: Injectable medication (Actemra, Benlysta, Cimzia, Cosentyx, Enbrel, Humira, Kevzara, Orencia, Remicade, Simponi, Stelara, Taltz, Tremfya) Methotrexate Leflunomide (Arava) Mycophenolate (Cellcept) Xeljanz, Olumiant, or Rinvoq  IF YOU HAVE A SURGERY SCHEDULED: Hold the medication dose that is due BEFORE your surgery if you take the following: Injectable medication (Actemra, Benlysta, Cimzia, Cosentyx, Enbrel, Humira, Kevzara, Orencia, Remicade, Rituxan, Simponi, Stelara, Taltz, Tremfya) Methotrexate Leflunomide (Arava) Mycophenolate (Cellcept) Hold 3 days before surgery: Morrie Sheldon, Rinvoq, Olumiant Hold 1 week before surgery: azathioprine (Imuran), mycophenolate (CellCept), methotrexate You do NOT have to hold: hydroxychloroquine (Plaquenil), leflunomide (Arava), sulfasalazine, or [place], you will need clearance by your SURGEON to resume your medication and have no signs of infection. They may send [place] a note or  call our clinic to provide clearance.

## 2022-05-01 LAB — CBC WITH DIFFERENTIAL/PLATELET
Absolute Monocytes: 573 cells/uL (ref 200–950)
Basophils Absolute: 49 cells/uL (ref 0–200)
Basophils Relative: 0.8 %
Eosinophils Absolute: 110 cells/uL (ref 15–500)
Eosinophils Relative: 1.8 %
HCT: 42 % (ref 38.5–50.0)
Hemoglobin: 14.2 g/dL (ref 13.2–17.1)
Lymphs Abs: 1183 cells/uL (ref 850–3900)
MCH: 29.6 pg (ref 27.0–33.0)
MCHC: 33.8 g/dL (ref 32.0–36.0)
MCV: 87.5 fL (ref 80.0–100.0)
MPV: 11.1 fL (ref 7.5–12.5)
Monocytes Relative: 9.4 %
Neutro Abs: 4185 cells/uL (ref 1500–7800)
Neutrophils Relative %: 68.6 %
Platelets: 209 10*3/uL (ref 140–400)
RBC: 4.8 10*6/uL (ref 4.20–5.80)
RDW: 13.5 % (ref 11.0–15.0)
Total Lymphocyte: 19.4 %
WBC: 6.1 10*3/uL (ref 3.8–10.8)

## 2022-05-01 LAB — COMPLETE METABOLIC PANEL WITH GFR
AG Ratio: 2 (calc) (ref 1.0–2.5)
ALT: 13 U/L (ref 9–46)
AST: 14 U/L (ref 10–35)
Albumin: 4.4 g/dL (ref 3.6–5.1)
Alkaline phosphatase (APISO): 47 U/L (ref 35–144)
BUN: 18 mg/dL (ref 7–25)
CO2: 27 mmol/L (ref 20–32)
Calcium: 9.5 mg/dL (ref 8.6–10.3)
Chloride: 105 mmol/L (ref 98–110)
Creat: 0.92 mg/dL (ref 0.70–1.30)
Globulin: 2.2 g/dL (calc) (ref 1.9–3.7)
Glucose, Bld: 86 mg/dL (ref 65–99)
Potassium: 4.4 mmol/L (ref 3.5–5.3)
Sodium: 141 mmol/L (ref 135–146)
Total Bilirubin: 0.6 mg/dL (ref 0.2–1.2)
Total Protein: 6.6 g/dL (ref 6.1–8.1)
eGFR: 99 mL/min/{1.73_m2} (ref >=60)

## 2022-05-02 NOTE — Progress Notes (Signed)
CBC and CMP are normal.

## 2022-05-19 ENCOUNTER — Other Ambulatory Visit: Payer: Self-pay | Admitting: Rheumatology

## 2022-05-19 NOTE — Telephone Encounter (Signed)
Next Visit: 10/01/2022  Last Visit: 04/30/2022  Last Fill: 02/22/2022  DX: Seropositive rheumatoid arthritis   Current Dose per office note 04/30/2022: Methotrexate 6 tablets po once weekly   Labs: 04/30/2022 CBC and CMP are normal.  Okay to refill MTX?

## 2022-07-21 ENCOUNTER — Other Ambulatory Visit: Payer: Self-pay | Admitting: Urology

## 2022-07-21 DIAGNOSIS — R972 Elevated prostate specific antigen [PSA]: Secondary | ICD-10-CM

## 2022-08-19 ENCOUNTER — Other Ambulatory Visit: Payer: Self-pay

## 2022-08-19 ENCOUNTER — Other Ambulatory Visit: Payer: Self-pay | Admitting: Physician Assistant

## 2022-08-19 DIAGNOSIS — Z79899 Other long term (current) drug therapy: Secondary | ICD-10-CM

## 2022-08-19 MED ORDER — FOLIC ACID 1 MG PO TABS: 2.0000 mg | ORAL_TABLET | Freq: Every day | ORAL | 3 refills | Status: DC

## 2022-08-19 NOTE — Telephone Encounter (Signed)
Next Visit: 10/01/2022  Last Visit: 04/30/2022  Last Fill: 05/19/2022 MTX  10/15/4112 folic acid  DX: Seropositive rheumatoid arthritis   Current Dose per office note on 04/30/2022: Methotrexate 6 tablets po once weekly and folic acid 2 mg by mouth daily.   Labs: 04/30/2022  CBC and CMP are normal.   Advised patient he is due to update labs, patient verbalized understanding and will update next week. Pended lab orders.   Okay to refill MTX and folic acid?

## 2022-08-20 LAB — COMPLETE METABOLIC PANEL WITH GFR
AG Ratio: 1.8 (calc) (ref 1.0–2.5)
ALT: 13 U/L (ref 9–46)
AST: 12 U/L (ref 10–35)
Albumin: 4.4 g/dL (ref 3.6–5.1)
Alkaline phosphatase (APISO): 44 U/L (ref 35–144)
BUN: 20 mg/dL (ref 7–25)
CO2: 28 mmol/L (ref 20–32)
Calcium: 9.2 mg/dL (ref 8.6–10.3)
Chloride: 105 mmol/L (ref 98–110)
Creat: 0.98 mg/dL (ref 0.70–1.30)
Globulin: 2.5 g/dL (calc) (ref 1.9–3.7)
Glucose, Bld: 86 mg/dL (ref 65–99)
Potassium: 4.4 mmol/L (ref 3.5–5.3)
Sodium: 141 mmol/L (ref 135–146)
Total Bilirubin: 0.4 mg/dL (ref 0.2–1.2)
Total Protein: 6.9 g/dL (ref 6.1–8.1)
eGFR: 91 mL/min/{1.73_m2} (ref >=60)

## 2022-08-20 LAB — CBC WITH DIFFERENTIAL/PLATELET
Absolute Monocytes: 577 cells/uL (ref 200–950)
Basophils Absolute: 62 cells/uL (ref 0–200)
Basophils Relative: 1 %
Eosinophils Absolute: 167 cells/uL (ref 15–500)
Eosinophils Relative: 2.7 %
HCT: 42 % (ref 38.5–50.0)
Hemoglobin: 14.4 g/dL (ref 13.2–17.1)
Lymphs Abs: 1680 cells/uL (ref 850–3900)
MCH: 29.9 pg (ref 27.0–33.0)
MCHC: 34.3 g/dL (ref 32.0–36.0)
MCV: 87.3 fL (ref 80.0–100.0)
MPV: 11 fL (ref 7.5–12.5)
Monocytes Relative: 9.3 %
Neutro Abs: 3714 cells/uL (ref 1500–7800)
Neutrophils Relative %: 59.9 %
Platelets: 216 10*3/uL (ref 140–400)
RBC: 4.81 10*6/uL (ref 4.20–5.80)
RDW: 13.3 % (ref 11.0–15.0)
Total Lymphocyte: 27.1 %
WBC: 6.2 10*3/uL (ref 3.8–10.8)

## 2022-08-20 NOTE — Progress Notes (Signed)
CBC and CMP WNL

## 2022-09-17 NOTE — Progress Notes (Signed)
Office Visit Note  Patient: Christian Gentry             Date of Birth: [birth year]           MRN: [id number]             PCP: Christain Sacramento, MD Referring: Christain Sacramento, MD Visit Date: 10/01/2022 Occupation: '@GUAROCC'$ @  Subjective:  Medication management  History of Present Illness: Christian Gentry is a 57 y.o. male with history of rheumatoid arthritis and osteoarthritis overlap.  He states he has not had any flares of rheumatoid arthritis.  He has been taking methotrexate 6 tablets weekly without any interruption.  He has been experiencing some discomfort in his bilateral fourth toe.  He states he has a new pair of shoes since then has been having increased discomfort.  He denies any morning stiffness.    Activities of Daily Living:  Patient reports morning stiffness for 0 minutes.   Patient Reports nocturnal pain.  Difficulty dressing/grooming: Denies Difficulty climbing stairs: Denies Difficulty getting out of chair: Denies Difficulty using hands for taps, buttons, cutlery, and/or writing: Denies  Review of Systems  Constitutional:  Negative for fatigue.  HENT:  Negative for mouth sores and mouth dryness.   Eyes:  Positive for dryness.  Respiratory:  Negative for shortness of breath.   Cardiovascular:  Negative for chest pain and palpitations.  Gastrointestinal:  Negative for blood in stool, constipation and diarrhea.  Endocrine: Negative for increased urination.  Genitourinary:  Negative for involuntary urination.  Musculoskeletal:  Positive for myalgias and myalgias. Negative for joint pain, gait problem, joint pain, joint swelling, muscle weakness, morning stiffness and muscle tenderness.  Skin:  Negative for color change, rash, hair loss and sensitivity to sunlight.  Allergic/Immunologic: Negative for susceptible to infections.  Neurological:  Negative for dizziness and headaches.  Hematological:  Negative for swollen glands.  Psychiatric/Behavioral:  Negative for  depressed mood and sleep disturbance. The patient is not nervous/anxious.     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   Plantar fasciitis 09/03/2016   Dyslipidemia 09/03/2016   Goiter 09/03/2016   Bilateral hearing loss 09/03/2016   History of neutropenia 09/03/2016    Past Medical History:  Diagnosis Date   Hearing difficulty    Hypercholesteremia    Rash    Rheumatoid arthritis ([place])    Thyroid disease     Family History  Problem Relation Age of Onset   Cancer Father        prostate,lung   Cancer Other    Seizures Other    Thyroid disease Other    Past Surgical History:  Procedure Laterality Date   PROSTATE BIOPSY  06/2021   THYROID LOBECTOMY  10/21/2011   THYROID LOBECTOMY  10/21/2011   Procedure: THYROID LOBECTOMY;  Surgeon: Haywood Lasso, MD;  Location: [facility];  Service: General;  Laterality: Left;  Removal left lobe of thyroid   Social History   Social History Narrative   Not on file    There is no immunization history on file for this patient.   Objective: Vital Signs: BP 120/87 (BP Location: Left Arm, Patient Position: Sitting,  Cuff Size: Normal)   Pulse 70   Resp 17   Ht '6\' 2"'$  (1.88 m)   Wt 218 lb 3.2 oz (99 kg)   BMI 28.02 kg/m    Physical Exam Vitals and nursing note reviewed.  Constitutional:      Appearance: He is well-developed.  HENT:     Head: Normocephalic and atraumatic.  Eyes:     Conjunctiva/sclera: Conjunctivae normal.     Pupils: Pupils are equal, round, and reactive to light.  Cardiovascular:     Rate and Rhythm: Normal rate and regular rhythm.     Heart sounds: Normal heart  sounds.  Pulmonary:     Effort: Pulmonary effort is normal.     Breath sounds: Normal breath sounds.  Abdominal:     General: Bowel sounds are normal.     Palpations: Abdomen is soft.  Musculoskeletal:     Cervical back: Normal range of motion and neck supple.  Skin:    General: Skin is warm and dry.     Capillary Refill: Capillary refill takes less than 2 seconds.  Neurological:     Mental Status: He is alert and oriented to person, place, and time.  Psychiatric:        Behavior: Behavior normal.      Musculoskeletal Exam: Cervical, thoracic and lumbar spine were in good range of motion.  Shoulder joints, elbow joints, wrist joints, MCPs PIPs and DIPs been good range of motion with no synovitis.  He had bilateral elbow joint contractures which were unchanged without any synovitis.  Hip joints and knee joints were in good range of motion without any warmth swelling or effusion.  He had bilateral PIP and DIP thickening and hammertoe formation.  CDAI Exam: CDAI Score: -- Patient Global: 1 mm; Provider Global: 1 mm Swollen: --; Tender: -- Joint Exam 10/01/2022   No joint exam has been documented for this visit   There is currently no information documented on the homunculus. Go to the Rheumatology activity and complete the homunculus joint exam.  Investigation: No additional findings.  Imaging: No results found.  Recent Labs: Lab Results  Component Value Date   WBC 6.2 08/19/2022   HGB 14.4 08/19/2022   PLT 216 08/19/2022   NA 141 08/19/2022   K 4.4 08/19/2022   CL 105 08/19/2022   CO2 28 08/19/2022   GLUCOSE 86 08/19/2022   BUN 20 08/19/2022   CREATININE 0.98 08/19/2022   BILITOT 0.4 08/19/2022   ALKPHOS 35 (L) 02/04/2017   AST 12 08/19/2022   ALT 13 08/19/2022   PROT 6.9 08/19/2022   ALBUMIN 4.1 02/04/2017   CALCIUM 9.2 08/19/2022   GFRAA 115 12/05/2020    Speciality Comments: PLQ EYE Exam: 09/07/18 WNL @  [facility] follow up 1 year  Procedures:  No  procedures performed Allergies: Patient has no known allergies.   Assessment / Plan:     Visit Diagnoses: Seropositive rheumatoid arthritis ([place]) - +RF +CCP severe disease with synovitis, U/s exam in 12/2016 revealed only mild synovitis: He had no synovitis on the examination today.  He denies any morning stiffness.  He has been tolerating methotrexate without any side effects.  High risk medication use - Methotrexate 6 tablets po once weekly and folic acid 2 mg by mouth daily.  Labs obtained on August 19, 2022 were reviewed which were within normal limits.  He was advised to get labs every 3 months to monitor for drug toxicity.  He was advised to  hold methotrexate if he develops an infection and resume after the infection resolves.  Information regarding immunization was placed in the AVS.  Primary osteoarthritis of both hands-he has mild PIP and DIP thickening.  He had no synovitis on the examination.  He continues to have some stiffness in his hands.  Chondromalacia of both patellae-he has discomfort in his knee joints.  No warmth swelling or effusion was noted.  Primary osteoarthritis of both feet-he had bilateral PIP and DIP thickening with no synovitis.  He has been having discomfort in his bilateral fourth toe DIPJ joints.  Previous x-rays were reviewed.  He has significant PIP and DIP narrowing.  Proper fitting shoes and stretching exercises were emphasized.  Flexion contracture of left elbow - Unchanged  Flexion contracture of right elbow - Unchanged  Callus under metatarsal head-metatarsal pads were advised.  Enlarged prostate -   Patient states that his PSA was higher at this time and he will be getting repeat MRI.  History of goiter  Orders: No orders of the defined types were placed in this encounter.  No orders of the defined types were placed in this encounter.    Follow-Up Instructions: Return in about 5 months (around 03/02/2023) for Rheumatoid arthritis.   Bo Merino, MD  Note - This record has been created using Editor, commissioning.  Chart creation errors have been sought, but may not always  have been located. Such creation errors do not reflect on  the standard of medical care.

## 2022-09-18 ENCOUNTER — Other Ambulatory Visit: Payer: BC Managed Care – PPO

## 2022-10-01 ENCOUNTER — Ambulatory Visit: Payer: BC Managed Care – PPO | Attending: Rheumatology | Admitting: Rheumatology

## 2022-10-01 ENCOUNTER — Encounter: Payer: Self-pay | Admitting: Rheumatology

## 2022-10-01 VITALS — BP 120/87 | HR 70 | Resp 17 | Ht 74.0 in | Wt 218.2 lb

## 2022-10-01 DIAGNOSIS — L84 Corns and callosities: Secondary | ICD-10-CM

## 2022-10-01 DIAGNOSIS — M059 Rheumatoid arthritis with rheumatoid factor, unspecified: Secondary | ICD-10-CM

## 2022-10-01 DIAGNOSIS — M2241 Chondromalacia patellae, right knee: Secondary | ICD-10-CM

## 2022-10-01 DIAGNOSIS — M19041 Primary osteoarthritis, right hand: Secondary | ICD-10-CM | POA: Diagnosis not present

## 2022-10-01 DIAGNOSIS — M19042 Primary osteoarthritis, left hand: Secondary | ICD-10-CM

## 2022-10-01 DIAGNOSIS — M19071 Primary osteoarthritis, right ankle and foot: Secondary | ICD-10-CM

## 2022-10-01 DIAGNOSIS — Z79899 Other long term (current) drug therapy: Secondary | ICD-10-CM

## 2022-10-01 DIAGNOSIS — M24521 Contracture, right elbow: Secondary | ICD-10-CM

## 2022-10-01 DIAGNOSIS — M2242 Chondromalacia patellae, left knee: Secondary | ICD-10-CM

## 2022-10-01 DIAGNOSIS — M19072 Primary osteoarthritis, left ankle and foot: Secondary | ICD-10-CM

## 2022-10-01 DIAGNOSIS — N400 Enlarged prostate without lower urinary tract symptoms: Secondary | ICD-10-CM

## 2022-10-01 DIAGNOSIS — M24522 Contracture, left elbow: Secondary | ICD-10-CM

## 2022-10-01 DIAGNOSIS — Z8639 Personal history of other endocrine, nutritional and metabolic disease: Secondary | ICD-10-CM

## 2022-10-01 NOTE — Patient Instructions (Signed)
Standing Labs We placed an order today for your standing lab work.   Please have your standing labs drawn in March and every 3 months  Please have your labs drawn 2 weeks prior to your appointment so that the provider can discuss your lab results at your appointment.  Please note that you may see your imaging and lab results in [place] before we have reviewed them. We will contact you once all results are reviewed. Please allow our office up to 72 hours to thoroughly review all of the results before contacting the office for clarification of your results.  Lab hours are:   Monday through Thursday from 8:00 am -12:30 pm and 1:00 pm-5:00 pm and Friday from 8:00 am-12:00 pm.  Please be advised, all patients with office appointments requiring lab work will take precedent over walk-in lab work.   Labs are drawn by [facility]. Please bring your co-pay at the time of your lab draw.  You may receive a bill from [facility] for your lab work.  Please note if you are on Hydroxychloroquine and and an order has been placed for a Hydroxychloroquine level, you will need to have it drawn 4 hours or more after your last dose.  If you wish to have your labs drawn at another location, please call the office 24 hours in advance so we can fax the orders.  The office is located at [address] [place], [place], [place] [address] No appointment is necessary.    If you have any questions regarding directions or hours of operation,  please call [phone].   As a reminder, please drink plenty of water prior to coming for your lab work. Thanks!   Vaccines You are taking a medication(s) that can suppress your immune system.  The following immunizations are recommended: Flu annually Covid-19  RSV Td/Tdap (tetanus, diphtheria, pertussis) every 10 years Pneumonia (Prevnar 15 then Pneumovax 23 at least 1 year apart.  Alternatively, can take Prevnar 20 without needing additional dose) Shingrix: 2 doses from 4  weeks to 6 months apart  If you have signs or symptoms of an infection or start antibiotics: First, call your PCP for workup of your infection. Hold your medication through the infection, until you complete your antibiotics, and until symptoms resolve if you take the following: Injectable medication (Actemra, Benlysta, Cimzia, Cosentyx, Enbrel, Humira, Kevzara, Orencia, Remicade, Simponi, Stelara, Taltz, Tremfya) Methotrexate Leflunomide (Arava) Mycophenolate (Cellcept) Roma Kayser, or Rinvoq   Please check with your PCP to make sure you are up to date.

## 2022-10-20 ENCOUNTER — Encounter: Payer: Self-pay | Admitting: Urology

## 2022-10-23 ENCOUNTER — Ambulatory Visit
Admission: RE | Admit: 2022-10-23 | Discharge: 2022-10-23 | Disposition: A | Discharge status: Home / Self Care | Payer: BC Managed Care – PPO | Source: Ambulatory Visit | Attending: Urology | Admitting: Urology

## 2022-10-23 DIAGNOSIS — R972 Elevated prostate specific antigen [PSA]: Secondary | ICD-10-CM

## 2022-10-23 MED ORDER — GADOPICLENOL 0.5 MMOL/ML IV SOLN
9.6000 mL | Freq: Once | INTRAVENOUS | Status: AC | PRN
  Administered 2022-10-23: 9.6 mL via INTRAVENOUS

## 2022-12-20 ENCOUNTER — Encounter: Payer: Self-pay | Admitting: *Deleted

## 2022-12-20 ENCOUNTER — Other Ambulatory Visit: Payer: Self-pay | Admitting: Physician Assistant

## 2022-12-20 NOTE — Telephone Encounter (Signed)
Last Fill: 08/19/2022  Labs: 08/19/2022 CBC and CMP WNL   Next Visit: 02/24/2023  Last Visit: 10/01/2022  DX: Seropositive rheumatoid arthritis   Current Dose per office note 10/01/2022: Methotrexate 6 tablets po once weekly   Sent message via my chart to advise patient he is due to update labs.   Okay to refill Methotrexate?

## 2023-01-18 ENCOUNTER — Other Ambulatory Visit: Payer: Self-pay | Admitting: Physician Assistant

## 2023-01-28 ENCOUNTER — Other Ambulatory Visit: Payer: Self-pay | Admitting: *Deleted

## 2023-01-28 ENCOUNTER — Telehealth: Payer: Self-pay | Admitting: *Deleted

## 2023-01-28 NOTE — Telephone Encounter (Signed)
Patient left message requesting a refill on MTX. Patient would like to know when he is due for labs and what the walk-in lab hours are.

## 2023-01-28 NOTE — Telephone Encounter (Signed)
I called patient 

## 2023-01-28 NOTE — Telephone Encounter (Signed)
Last Fill: 12/20/2022  Labs: 08/20/2023 CBC and CMP WNL, I called patient, patient will have labs drawn next week  Next Visit: 02/24/2023  Last Visit: 10/01/2022  DX: Seropositive rheumatoid arthritis   Current Dose per office note 10/01/2022: Methotrexate 6 tablets po once weekly   Okay to refill Methotrexate?

## 2023-01-30 MED ORDER — METHOTREXATE SODIUM 2.5 MG PO TABS: ORAL_TABLET | ORAL | 0 refills | Status: DC

## 2023-02-03 ENCOUNTER — Other Ambulatory Visit: Payer: Self-pay | Admitting: *Deleted

## 2023-02-03 DIAGNOSIS — Z79899 Other long term (current) drug therapy: Secondary | ICD-10-CM

## 2023-02-04 LAB — COMPLETE METABOLIC PANEL WITH GFR
AG Ratio: 2 (calc) (ref 1.0–2.5)
ALT: 11 U/L (ref 9–46)
AST: 12 U/L (ref 10–35)
Albumin: 4.5 g/dL (ref 3.6–5.1)
Alkaline phosphatase (APISO): 40 U/L (ref 35–144)
BUN: 21 mg/dL (ref 7–25)
CO2: 26 mmol/L (ref 20–32)
Calcium: 9.3 mg/dL (ref 8.6–10.3)
Chloride: 105 mmol/L (ref 98–110)
Creat: 0.86 mg/dL (ref 0.70–1.30)
Globulin: 2.3 g/dL (calc) (ref 1.9–3.7)
Glucose, Bld: 90 mg/dL (ref 65–99)
Potassium: 4.4 mmol/L (ref 3.5–5.3)
Sodium: 140 mmol/L (ref 135–146)
Total Bilirubin: 0.6 mg/dL (ref 0.2–1.2)
Total Protein: 6.8 g/dL (ref 6.1–8.1)
eGFR: 102 mL/min/{1.73_m2} (ref >=60)

## 2023-02-04 LAB — CBC WITH DIFFERENTIAL/PLATELET
Absolute Monocytes: 521 cells/uL (ref 200–950)
Basophils Absolute: 59 cells/uL (ref 0–200)
Basophils Relative: 0.9 %
Eosinophils Absolute: 112 cells/uL (ref 15–500)
Eosinophils Relative: 1.7 %
HCT: 43.1 % (ref 38.5–50.0)
Hemoglobin: 14.4 g/dL (ref 13.2–17.1)
Lymphs Abs: 1756 cells/uL (ref 850–3900)
MCH: 29.5 pg (ref 27.0–33.0)
MCHC: 33.4 g/dL (ref 32.0–36.0)
MCV: 88.3 fL (ref 80.0–100.0)
MPV: 10.7 fL (ref 7.5–12.5)
Monocytes Relative: 7.9 %
Neutro Abs: 4151 cells/uL (ref 1500–7800)
Neutrophils Relative %: 62.9 %
Platelets: 216 10*3/uL (ref 140–400)
RBC: 4.88 10*6/uL (ref 4.20–5.80)
RDW: 13.8 % (ref 11.0–15.0)
Total Lymphocyte: 26.6 %
WBC: 6.6 10*3/uL (ref 3.8–10.8)

## 2023-02-08 NOTE — Progress Notes (Signed)
CBC and CMP WNL

## 2023-02-10 NOTE — Progress Notes (Signed)
Office Visit Note  Patient: Christian Gentry             Date of Birth: 03/07/1966           MRN: [id number]             PCP: Barbie Banner, MD Referring: Barbie Banner, MD Visit Date: 02/24/2023 Occupation: @GUAROCC @  Subjective:  Medication management  History of Present Illness: Christian Noris Hohman is a 57 y.o. male with seropositive rheumatoid arthritis and osteoarthritis overlap.  He states he has been doing well on methotrexate 6 tablets p.o. weekly.  He has been taking medication without any interruption.  He denies any side effects.  He states that he developed some stiffness in his hands after doing yard work.  He has had no discomfort in his joints or any flares.  He has been under stress due to upcoming prostate biopsy.    Activities of Daily Living:  Patient reports morning stiffness for 0 minutes.   Patient Denies nocturnal pain.  Difficulty dressing/grooming: Denies Difficulty climbing stairs: Denies Difficulty getting out of chair: Denies Difficulty using hands for taps, buttons, cutlery, and/or writing: Denies  Review of Systems  Constitutional:  Negative for fatigue.  HENT:  Negative for mouth sores and mouth dryness.   Eyes:  Positive for dryness.  Respiratory:  Negative for shortness of breath.   Cardiovascular:  Negative for chest pain and palpitations.  Gastrointestinal:  Negative for blood in stool, constipation and diarrhea.  Endocrine: Negative for increased urination.  Genitourinary:  Negative for painful urination and involuntary urination.  Musculoskeletal:  Negative for joint pain, gait problem, joint pain, joint swelling, myalgias, muscle weakness, morning stiffness, muscle tenderness and myalgias.  Skin:  Negative for color change, rash, hair loss and sensitivity to sunlight.  Allergic/Immunologic: Negative for susceptible to infections.  Neurological:  Negative for dizziness and headaches.  Hematological:  Negative for swollen glands.   Psychiatric/Behavioral:  Negative for depressed mood and sleep disturbance. The patient is not nervous/anxious.     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   Plantar fasciitis 09/03/2016   Dyslipidemia 09/03/2016   Goiter 09/03/2016   Bilateral hearing loss 09/03/2016   History of neutropenia 09/03/2016    Past Medical History:  Diagnosis Date   Hearing difficulty    Hypercholesteremia    Rash    Rheumatoid arthritis (HCC)    Thyroid disease     Family History  Problem Relation Age of Onset   Cancer Father        prostate,lung   Cancer Other    Seizures Other    Thyroid disease Other    Past Surgical History:  Procedure Laterality Date   PROSTATE BIOPSY  06/2021   THYROID LOBECTOMY  10/21/2011   THYROID LOBECTOMY  10/21/2011   Procedure: THYROID LOBECTOMY;  Surgeon: Currie Paris, MD;  Location: [facility];  Service: General;  Laterality: Left;  Removal left lobe of thyroid   Social History   Social History Narrative   Not on file    There is no immunization history on file for this patient.   Objective: Vital Signs: BP Marland Kitchen)  134/94 (BP Location: Left Arm, Patient Position: Sitting, Cuff Size: Normal)   Pulse (!) 58   Resp 18   Ht 6\' 2"  (1.88 m)   Wt 219 lb 12.8 oz (99.7 kg)   BMI 28.22 kg/m    Physical Exam Vitals and nursing note reviewed.  Constitutional:      Appearance: He is well-developed.  HENT:     Head: Normocephalic and atraumatic.  Eyes:     Conjunctiva/sclera: Conjunctivae normal.     Pupils: Pupils are equal, round, and reactive to light.  Cardiovascular:     Rate and Rhythm: Normal rate  and regular rhythm.     Heart sounds: Normal heart sounds.  Pulmonary:     Effort: Pulmonary effort is normal.     Breath sounds: Normal breath sounds.  Abdominal:     General: Bowel sounds are normal.     Palpations: Abdomen is soft.  Musculoskeletal:     Cervical back: Normal range of motion and neck supple.  Skin:    General: Skin is warm and dry.     Capillary Refill: Capillary refill takes less than 2 seconds.  Neurological:     Mental Status: He is alert and oriented to person, place, and time.  Psychiatric:        Behavior: Behavior normal.      Musculoskeletal Exam: Cervical, thoracic and lumbar spine were in good range of motion.  Shoulder joints, elbow joints, wrist joints, MCPs PIPs and DIPs were in good range of motion with no synovitis.  He had bilateral PIP and DIP thickening.  Hip joints and knee joints were in good range of motion.  No warmth swelling or effusion was noted.  He had no tenderness over ankles or MTPs.  He had bilateral PIP and DIP thickening.  CDAI Exam: CDAI Score: -- Patient Global: 0 / 100; Provider Global: 0 / 100 Swollen: --; Tender: -- Joint Exam 02/24/2023   No joint exam has been documented for this visit   There is currently no information documented on the homunculus. Go to the Rheumatology activity and complete the homunculus joint exam.  Investigation: No additional findings.  Imaging: No results found.  Recent Labs: Lab Results  Component Value Date   WBC 6.6 02/03/2023   HGB 14.4 02/03/2023   PLT 216 02/03/2023   NA 140 02/03/2023   K 4.4 02/03/2023   CL 105 02/03/2023   CO2 26 02/03/2023   GLUCOSE 90 02/03/2023   BUN 21 02/03/2023   CREATININE 0.86 02/03/2023   BILITOT 0.6 02/03/2023   ALKPHOS 35 (L) 02/04/2017   AST 12 02/03/2023   ALT 11 02/03/2023   PROT 6.8 02/03/2023   ALBUMIN 4.1 02/04/2017   CALCIUM 9.3 02/03/2023   GFRAA 115 12/05/2020    Speciality Comments: PLQ EYE Exam: 09/07/18 WNL @  [facility]  follow up 1 year  Procedures:  No procedures performed Allergies: Patient has no known allergies.   Assessment / Plan:     Visit Diagnoses: Seropositive rheumatoid arthritis (HCC) - +RF +CCP severe disease with synovitis in the past.  His disease is very well-controlled on methotrexate 6 tablets p.o. weekly.  He denies any joint pain or joint swelling.  He has some joint stiffness with activities especially yard work.  High risk medication use - Methotrexate 6 tablets po once weekly and folic acid 1 mg by mouth daily.  Labs obtained on Feb 03, 2023 CBC and CMP were normal.  Lab results were  discussed with the patient.  He was advised to get labs every 3 months.  Information on immunization was placed in the 80s.  He was advised to hold methotrexate if he develops an infection resume after the infection resolves.  Primary osteoarthritis of both hands-he had bilateral PIP and DIP thickening.  Joint protection muscle strengthening was discussed.  Chondromalacia of both patellae-he denies any discomfort today.  Primary osteoarthritis of both feet-he denies discomfort in his feet.  Bilateral PIP and DIP thickening with no synovitis was noted.  Flexion contracture of left elbow - Unchanged  Flexion contracture of right elbow - Unchanged  Enlarged prostate -patient has been followed by urology and is scheduled to have prostate biopsy.  He plans to stop methotrexate 1 week prior to biopsy and resume after the biopsy.  History of goiter  Orders: No orders of the defined types were placed in this encounter.  No orders of the defined types were placed in this encounter.    Follow-Up Instructions: Return in about 5 months (around 07/27/2023) for Rheumatoid arthritis.   Pollyann Savoy, MD  Note - This record has been created using Animal nutritionist.  Chart creation errors have been sought, but may not always  have been located. Such creation errors do not reflect on  the standard of  medical care.

## 2023-02-24 ENCOUNTER — Encounter: Payer: Self-pay | Admitting: Rheumatology

## 2023-02-24 ENCOUNTER — Ambulatory Visit: Payer: BC Managed Care – PPO | Attending: Rheumatology | Admitting: Rheumatology

## 2023-02-24 VITALS — BP 134/94 | HR 58 | Resp 18 | Ht 74.0 in | Wt 219.8 lb

## 2023-02-24 DIAGNOSIS — M19071 Primary osteoarthritis, right ankle and foot: Secondary | ICD-10-CM

## 2023-02-24 DIAGNOSIS — M24522 Contracture, left elbow: Secondary | ICD-10-CM

## 2023-02-24 DIAGNOSIS — N400 Enlarged prostate without lower urinary tract symptoms: Secondary | ICD-10-CM

## 2023-02-24 DIAGNOSIS — M19041 Primary osteoarthritis, right hand: Secondary | ICD-10-CM | POA: Diagnosis not present

## 2023-02-24 DIAGNOSIS — M2241 Chondromalacia patellae, right knee: Secondary | ICD-10-CM

## 2023-02-24 DIAGNOSIS — Z8639 Personal history of other endocrine, nutritional and metabolic disease: Secondary | ICD-10-CM

## 2023-02-24 DIAGNOSIS — M19072 Primary osteoarthritis, left ankle and foot: Secondary | ICD-10-CM

## 2023-02-24 DIAGNOSIS — M2242 Chondromalacia patellae, left knee: Secondary | ICD-10-CM

## 2023-02-24 DIAGNOSIS — Z79899 Other long term (current) drug therapy: Secondary | ICD-10-CM

## 2023-02-24 DIAGNOSIS — M059 Rheumatoid arthritis with rheumatoid factor, unspecified: Secondary | ICD-10-CM

## 2023-02-24 DIAGNOSIS — L84 Corns and callosities: Secondary | ICD-10-CM

## 2023-02-24 DIAGNOSIS — M24521 Contracture, right elbow: Secondary | ICD-10-CM

## 2023-02-24 DIAGNOSIS — M19042 Primary osteoarthritis, left hand: Secondary | ICD-10-CM

## 2023-02-24 NOTE — Patient Instructions (Addendum)
Standing Labs We placed an order today for your standing lab work.   Please have your standing labs drawn in [place] and every 3 months  Please have your labs drawn 2 weeks prior to your appointment so that the provider can discuss your lab results at your appointment, if possible.  Please note that you may see your imaging and lab results in MyChart before we have reviewed them. We will contact you once all results are reviewed. Please allow our office up to 72 hours to thoroughly review all of the results before contacting the office for clarification of your results.  WALK-IN LAB HOURS  Monday through Thursday from 8:00 am -12:30 pm and 1:00 pm-5:00 pm and Friday from 8:00 am-12:00 pm.  Patients with office visits requiring labs will be seen before walk-in labs.  You may encounter longer than normal wait times. Please allow additional time. Wait times may be shorter on  Monday and Thursday afternoons.  We do not book appointments for walk-in labs. We appreciate your patience and understanding with our staff.   Labs are drawn by [facility]. Please bring your co-pay at the time of your lab draw.  You may receive a bill from [facility] for your lab work.  Please note if you are on Hydroxychloroquine and and an order has been placed for a Hydroxychloroquine level,  you will need to have it drawn 4 hours or more after your last dose.  If you wish to have your labs drawn at another location, please call the office 24 hours in advance so we can fax the orders.  The office is located at [address], [address], [place], [place] [address]   If you have any questions regarding directions or hours of operation,  please call [phone].   As a reminder, please drink plenty of water prior to coming for your lab work. Thanks!  Vaccines You are taking a medication(s) that can suppress your immune system.  The following immunizations are recommended: Flu annually Covid-19  Td/Tdap (tetanus, diphtheria,  pertussis) every 10 years Pneumonia (Prevnar 15 then Pneumovax 23 at least 1 year apart.  Alternatively, can take Prevnar 20 without needing additional dose) Shingrix: 2 doses from 4 weeks to 6 months apart  Please check with your PCP to make sure you are up to date.   If you have signs or symptoms of an infection or start antibiotics: First, call your PCP for workup of your infection. Hold your medication through the infection, until you complete your antibiotics, and until symptoms resolve if you take the following: Injectable medication (Actemra, Benlysta, Cimzia, Cosentyx, Enbrel, Humira, Kevzara, Orencia, Remicade, Simponi, Stelara, Taltz, Tremfya) Methotrexate Leflunomide (Arava) Mycophenolate (Cellcept) Harriette Ohara, Olumiant, or Rinvoq

## 2023-02-25 ENCOUNTER — Other Ambulatory Visit: Payer: Self-pay | Admitting: Rheumatology

## 2023-02-25 NOTE — Telephone Encounter (Signed)
Last Fill: 01/30/2023 (30 day supply)  Labs: 02/03/2023 CBC and CMP WNL   Next Visit: 09/01/2023  Last Visit: 02/24/2023  DX: Seropositive rheumatoid arthritis   Current Dose per office note 02/24/2023: Methotrexate 6 tablets po once weekly   Okay to refill Methotrexate?

## 2023-06-16 ENCOUNTER — Other Ambulatory Visit: Payer: Self-pay | Admitting: Rheumatology

## 2023-06-17 NOTE — Telephone Encounter (Signed)
Last Fill: 02/25/2023   Labs: 02/03/2023 CBC and CMP WNL    Next Visit: 09/01/2023   Last Visit: 02/24/2023   DX: Seropositive rheumatoid arthritis    Current Dose per office note 02/24/2023: Methotrexate 6 tablets po once weekly   Patient advised he is due to update labs. Patient states he will come to the office next Thursday or Friday to update.    Okay to refill Methotrexate?

## 2023-06-24 ENCOUNTER — Other Ambulatory Visit: Payer: Self-pay | Admitting: *Deleted

## 2023-06-24 DIAGNOSIS — Z79899 Other long term (current) drug therapy: Secondary | ICD-10-CM

## 2023-06-25 LAB — COMPLETE METABOLIC PANEL WITH GFR
AG Ratio: 2 (calc) (ref 1.0–2.5)
ALT: 14 U/L (ref 9–46)
AST: 11 U/L (ref 10–35)
Albumin: 4.3 g/dL (ref 3.6–5.1)
Alkaline phosphatase (APISO): 45 U/L (ref 35–144)
BUN: 18 mg/dL (ref 7–25)
CO2: 29 mmol/L (ref 20–32)
Calcium: 9.3 mg/dL (ref 8.6–10.3)
Chloride: 105 mmol/L (ref 98–110)
Creat: 0.95 mg/dL (ref 0.70–1.30)
Globulin: 2.2 g/dL (ref 1.9–3.7)
Glucose, Bld: 98 mg/dL (ref 65–99)
Potassium: 4 mmol/L (ref 3.5–5.3)
Sodium: 142 mmol/L (ref 135–146)
Total Bilirubin: 0.6 mg/dL (ref 0.2–1.2)
Total Protein: 6.5 g/dL (ref 6.1–8.1)
eGFR: 95 mL/min/{1.73_m2} (ref >=60)

## 2023-06-25 LAB — CBC WITH DIFFERENTIAL/PLATELET
Absolute Monocytes: 460 {cells}/uL (ref 200–950)
Basophils Absolute: 38 {cells}/uL (ref 0–200)
Basophils Relative: 0.6 %
Eosinophils Absolute: 101 {cells}/uL (ref 15–500)
Eosinophils Relative: 1.6 %
HCT: 42.6 % (ref 38.5–50.0)
Hemoglobin: 14.3 g/dL (ref 13.2–17.1)
Lymphs Abs: 1254 {cells}/uL (ref 850–3900)
MCH: 29.4 pg (ref 27.0–33.0)
MCHC: 33.6 g/dL (ref 32.0–36.0)
MCV: 87.5 fL (ref 80.0–100.0)
MPV: 11.2 fL (ref 7.5–12.5)
Monocytes Relative: 7.3 %
Neutro Abs: 4448 {cells}/uL (ref 1500–7800)
Neutrophils Relative %: 70.6 %
Platelets: 209 10*3/uL (ref 140–400)
RBC: 4.87 10*6/uL (ref 4.20–5.80)
RDW: 14 % (ref 11.0–15.0)
Total Lymphocyte: 19.9 %
WBC: 6.3 10*3/uL (ref 3.8–10.8)

## 2023-06-26 NOTE — Progress Notes (Signed)
CBC and CMP WNL

## 2023-07-03 ENCOUNTER — Other Ambulatory Visit: Payer: Self-pay | Admitting: Physician Assistant

## 2023-07-04 NOTE — Telephone Encounter (Signed)
Last Fill: 06/17/2023 (30 day supply)  Labs: 06/24/2023 CBC and CMP WNL   Next Visit: 09/01/2023  Last Visit: 02/24/2023  DX:  Seropositive rheumatoid arthritis   Current Dose per office note 02/24/2023: Methotrexate 6 tablets po once weekly   Okay to refill Methotrexate?

## 2023-08-18 NOTE — Progress Notes (Deleted)
Office Visit Note  Patient: Christian Gentry             Date of Birth: 11-26-1965           MRN: [id number]             PCP: Barbie Banner, MD Referring: Barbie Banner, MD Visit Date: 09/01/2023 Occupation: @GUAROCC @  Subjective:  No chief complaint on file.   History of Present Illness: Christian Gentry is a 57 y.o. male ***     Activities of Daily Living:  Patient reports morning stiffness for *** {minute/hour:19697}.   Patient {ACTIONS;DENIES/REPORTS:21021675::"Denies"} nocturnal pain.  Difficulty dressing/grooming: {ACTIONS;DENIES/REPORTS:21021675::"Denies"} Difficulty climbing stairs: {ACTIONS;DENIES/REPORTS:21021675::"Denies"} Difficulty getting out of chair: {ACTIONS;DENIES/REPORTS:21021675::"Denies"} Difficulty using hands for taps, buttons, cutlery, and/or writing: {ACTIONS;DENIES/REPORTS:21021675::"Denies"}  No Rheumatology ROS completed.   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   Plantar fasciitis 09/03/2016   Dyslipidemia 09/03/2016   Goiter 09/03/2016   Bilateral hearing loss 09/03/2016   History of neutropenia 09/03/2016    Past Medical History:  Diagnosis Date   Hearing difficulty    Hypercholesteremia    Rash    Rheumatoid arthritis (HCC)    Thyroid disease     Family History  Problem Relation Age of Onset   Cancer Father        prostate,lung   Cancer Other    Seizures Other    Thyroid disease Other    Past Surgical History:  Procedure Laterality Date   PROSTATE BIOPSY  06/2021   THYROID LOBECTOMY  10/21/2011   THYROID LOBECTOMY  10/21/2011   Procedure: THYROID LOBECTOMY;  Surgeon:  Currie Paris, MD;  Location: [facility];  Service: General;  Laterality: Left;  Removal left lobe of thyroid   Social History   Social History Narrative   Not on file    There is no immunization history on file for this patient.   Objective: Vital Signs: There were no vitals taken for this visit.   Physical Exam   Musculoskeletal Exam: ***  CDAI Exam: CDAI Score: -- Patient Global: --; Provider Global: -- Swollen: --; Tender: -- Joint Exam 09/01/2023   No joint exam has been documented for this visit   There is currently no information documented on the homunculus. Go to the Rheumatology activity and complete the homunculus joint exam.  Investigation: No additional findings.  Imaging: No results found.  Recent Labs: Lab Results  Component Value Date   WBC 6.3 06/24/2023   HGB 14.3 06/24/2023   PLT 209 06/24/2023   NA 142 06/24/2023   K 4.0 06/24/2023   CL 105 06/24/2023   CO2 29 06/24/2023   GLUCOSE 98 06/24/2023   BUN 18 06/24/2023   CREATININE 0.95 06/24/2023   BILITOT 0.6 06/24/2023   ALKPHOS 35 (L) 02/04/2017   AST 11 06/24/2023   ALT 14 06/24/2023   PROT 6.5 06/24/2023   ALBUMIN 4.1 02/04/2017   CALCIUM 9.3 06/24/2023   GFRAA 115 12/05/2020    Speciality Comments: PLQ EYE Exam: 09/07/18 WNL @  [facility] follow up 1 year  Procedures:  No procedures performed Allergies: Patient has no known allergies.  Assessment / Plan:     Visit Diagnoses: No diagnosis found.  Orders: No orders of the defined types were placed in this encounter.  No orders of the defined types were placed in this encounter.   Face-to-face time spent with patient was *** minutes. Greater than 50% of time was spent in counseling and coordination of care.  Follow-Up Instructions: No follow-ups on file.   Ellen Henri, CMA  Note - This record has been created using Animal nutritionist.  Chart creation errors have been sought, but may not always  have been located.  Such creation errors do not reflect on  the standard of medical care.

## 2023-09-01 ENCOUNTER — Ambulatory Visit: Payer: BC Managed Care – PPO | Admitting: Rheumatology

## 2023-09-01 DIAGNOSIS — M2242 Chondromalacia patellae, left knee: Secondary | ICD-10-CM

## 2023-09-01 DIAGNOSIS — M19071 Primary osteoarthritis, right ankle and foot: Secondary | ICD-10-CM

## 2023-09-01 DIAGNOSIS — Z8639 Personal history of other endocrine, nutritional and metabolic disease: Secondary | ICD-10-CM

## 2023-09-01 DIAGNOSIS — N400 Enlarged prostate without lower urinary tract symptoms: Secondary | ICD-10-CM

## 2023-09-01 DIAGNOSIS — M059 Rheumatoid arthritis with rheumatoid factor, unspecified: Secondary | ICD-10-CM

## 2023-09-01 DIAGNOSIS — M19042 Primary osteoarthritis, left hand: Secondary | ICD-10-CM

## 2023-09-01 DIAGNOSIS — Z79899 Other long term (current) drug therapy: Secondary | ICD-10-CM

## 2023-09-01 DIAGNOSIS — M24522 Contracture, left elbow: Secondary | ICD-10-CM

## 2023-09-01 DIAGNOSIS — M24521 Contracture, right elbow: Secondary | ICD-10-CM

## 2023-09-07 NOTE — Progress Notes (Addendum)
 Please  Office Visit Note  Patient: Christian Gentry             Date of Birth: 15-Jun-1966           MRN: [id number]             PCP: Debrah Josette ORN., PA-C Referring: Tanda Prentice DEL, MD Visit Date: 09/20/2023 Occupation: @GUAROCC @  Subjective:  Medication management  History of Present Illness: Christian Gentry is a 57 y.o. male with seropositive rheumatoid arthritis and osteoarthritis.  He states that he has reduced methotrexate  to 6 tablets p.o. q. weekly.  He has not noticed any flare of arthritis.  He would like to reduce methotrexate  further.  He denies any morning stiffness.  Patient states he was evaluated by urology and had another biopsy of the prostate which was negative for prostate cancer.    Activities of Daily Living:  Patient reports morning stiffness for 0 minute.   Patient Denies nocturnal pain.  Difficulty dressing/grooming: Denies Difficulty climbing stairs: Denies Difficulty getting out of chair: Denies Difficulty using hands for taps, buttons, cutlery, and/or writing: Denies  Review of Systems  Constitutional:  Negative for fatigue.  HENT:  Negative for mouth sores and mouth dryness.   Eyes:  Positive for dryness.  Respiratory:  Negative for shortness of breath.   Cardiovascular:  Negative for chest pain and palpitations.  Gastrointestinal:  Negative for blood in stool, constipation and diarrhea.  Endocrine: Negative for increased urination.  Genitourinary:  Negative for involuntary urination.  Musculoskeletal:  Negative for joint pain, gait problem, joint pain, joint swelling, myalgias, muscle weakness, morning stiffness, muscle tenderness and myalgias.  Skin:  Negative for color change, rash, hair loss and sensitivity to sunlight.  Allergic/Immunologic: Negative for susceptible to infections.  Neurological:  Positive for dizziness. Negative for headaches.  Hematological:  Negative for swollen glands.  Psychiatric/Behavioral:  Negative for depressed  mood and sleep disturbance. The patient is not nervous/anxious.     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   Plantar fasciitis 09/03/2016   Dyslipidemia 09/03/2016   Goiter 09/03/2016   Bilateral hearing loss 09/03/2016   History of neutropenia 09/03/2016    Past Medical History:  Diagnosis Date   Hearing difficulty    Hypercholesteremia    Rash    Rheumatoid arthritis (HCC)    Thyroid  disease     Family History  Problem Relation Age of Onset   Cancer Father        prostate,lung   Prostate cancer Brother    Cancer Other    Seizures Other    Thyroid  disease Other    Past Surgical History:  Procedure Laterality Date   PROSTATE BIOPSY  06/2021   PROSTATE BIOPSY     02/2023   THYROID  LOBECTOMY  10/21/2011   THYROID  LOBECTOMY  10/21/2011   Procedure: THYROID  LOBECTOMY;  Surgeon: Sherlean JINNY Laughter, MD;  Location: [facility];  Service: General;  Laterality: Left;  Removal left lobe of thyroid    Social History   Social History Narrative   Not on file    There is no immunization history on file for this patient.   Objective:  Vital Signs: BP (!) 146/91 (BP Location: Left Arm, Patient Position: Sitting, Cuff Size: Normal)   Pulse 71   Resp 14   Ht 6' 2 (1.88 m)   Wt 221 lb (100.2 kg)   BMI 28.37 kg/m    Physical Exam Vitals and nursing note reviewed.  Constitutional:      Appearance: He is well-developed.  HENT:     Head: Normocephalic and atraumatic.  Eyes:     Conjunctiva/sclera: Conjunctivae normal.     Pupils: Pupils are equal, round, and reactive to light.  Cardiovascular:     Rate and Rhythm: Normal rate and  regular rhythm.     Heart sounds: Normal heart sounds.  Pulmonary:     Effort: Pulmonary effort is normal.     Breath sounds: Normal breath sounds.  Abdominal:     General: Bowel sounds are normal.     Palpations: Abdomen is soft.  Musculoskeletal:     Cervical back: Normal range of motion and neck supple.  Skin:    General: Skin is warm and dry.     Capillary Refill: Capillary refill takes less than 2 seconds.  Neurological:     Mental Status: He is alert and oriented to person, place, and time.  Psychiatric:        Behavior: Behavior normal.      Musculoskeletal Exam: Cervical, thoracic and lumbar spine 1 good range of motion.  Shoulders were in good range of motion.  He had bilateral elbow joint contractures which were unchanged.  Wrist joints, MCPs PIPs and DIPs been good range of motion with no synovitis.  He had bilateral PIP and DIP thickening.  Hip joints and knee joints with good range of motion without any warmth swelling or effusion.  There was no tenderness over ankles or MTPs.  CDAI Exam: CDAI Score: -- Patient Global: 0 / 100; Provider Global: 0 / 100 Swollen: --; Tender: -- Joint Exam 09/20/2023   No joint exam has been documented for this visit   There is currently no information documented on the homunculus. Go to the Rheumatology activity and complete the homunculus joint exam.  Investigation: No additional findings.  Imaging: No results found.  Recent Labs: Lab Results  Component Value Date   WBC 6.3 06/24/2023   HGB 14.3 06/24/2023   PLT 209 06/24/2023   NA 142 06/24/2023   K 4.0 06/24/2023   CL 105 06/24/2023   CO2 29 06/24/2023   GLUCOSE 98 06/24/2023   BUN 18 06/24/2023   CREATININE 0.95 06/24/2023   BILITOT 0.6 06/24/2023   ALKPHOS 35 (L) 02/04/2017   AST 11 06/24/2023   ALT 14 06/24/2023   PROT 6.5 06/24/2023   ALBUMIN 4.1 02/04/2017   CALCIUM 9.3 06/24/2023   GFRAA 115 12/05/2020    Speciality Comments: PLQ EYE Exam: 09/07/18 WNL  @  [facility] follow up 1 year  Procedures:  No procedures performed Allergies: Patient has no known allergies.   Assessment / Plan:     Visit Diagnoses: Seropositive rheumatoid arthritis (HCC) - Positive RF, positive anti-CCP, history of severe synovitis. -Patient denies having a flare of rheumatoid arthritis.  He has been on reduced dose of methotrexate  at 6 tablets p.o. weekly.  He has been on methotrexate  since 2017.  He would like to reduce the dose of methotrexate  further.  We discussed decreasing methotrexate  to 4 tablets p.o. weekly.  No synovitis was noted on the examination today.  Plan: methotrexate  (RHEUMATREX) 2.5 MG tablet  High risk medication use - Methotrexate  6 tablets p.o. weekly with folic acid  1 mg p.o. daily. - Plan: CBC with Differential/Platelet, COMPLETE METABOLIC PANEL WITH GFR today and then every 3 months.  Information on immunization was placed in the AVS.  He was advised to hold methotrexate  if he develops an infection and resume after the infection resolves.  Primary osteoarthritis of both hands-bilateral PIP and DIP thickening was noted.  No synovitis was noted.  Contracture of joint of both elbows-unchanged.  Chondromalacia of both patellae-he had no comfortable range of motion.  Primary osteoarthritis of both feet-he denies any discomfort today.  He has occasional discomfort over bilateral fourth toe.  Callus under metatarsal head-improved.  Screening for hyperlipidemia-I will check lipid panel today.  History of goiter  Enlarged prostate-patient is that he had recent biopsy which was negative for prostate cancer.  Dyslipidemia  Goiter  Other specified hearing loss of both ears  Orders: Orders Placed This Encounter  Procedures   CBC with Differential/Platelet   COMPLETE METABOLIC PANEL WITH GFR   Lipid panel   Meds ordered this encounter  Medications   methotrexate  (RHEUMATREX) 2.5 MG tablet    Sig: Take 4 tablets (10 mg total) by mouth  once a week. Caution:Chemotherapy. Protect from light.     Follow-Up Instructions: Return in about 5 months (around 02/18/2024) for Rheumatoid arthritis, Osteoarthritis.   Maya Nash, MD  Note - This record has been created using Animal nutritionist.  Chart creation errors have been sought, but may not always  have been located. Such creation errors do not reflect on  the standard of medical care.

## 2023-09-14 DIAGNOSIS — I10 Essential (primary) hypertension: Secondary | ICD-10-CM

## 2023-09-14 HISTORY — DX: Essential (primary) hypertension: I10

## 2023-09-20 ENCOUNTER — Ambulatory Visit: Payer: 59 | Attending: Rheumatology | Admitting: Rheumatology

## 2023-09-20 ENCOUNTER — Encounter: Payer: Self-pay | Admitting: Rheumatology

## 2023-09-20 VITALS — BP 146/91 | HR 71 | Resp 14 | Ht 74.0 in | Wt 221.0 lb

## 2023-09-20 DIAGNOSIS — Z13220 Encounter for screening for lipoid disorders: Secondary | ICD-10-CM

## 2023-09-20 DIAGNOSIS — M24521 Contracture, right elbow: Secondary | ICD-10-CM

## 2023-09-20 DIAGNOSIS — M2241 Chondromalacia patellae, right knee: Secondary | ICD-10-CM

## 2023-09-20 DIAGNOSIS — M19042 Primary osteoarthritis, left hand: Secondary | ICD-10-CM

## 2023-09-20 DIAGNOSIS — M19041 Primary osteoarthritis, right hand: Secondary | ICD-10-CM | POA: Diagnosis not present

## 2023-09-20 DIAGNOSIS — Z79899 Other long term (current) drug therapy: Secondary | ICD-10-CM

## 2023-09-20 DIAGNOSIS — E049 Nontoxic goiter, unspecified: Secondary | ICD-10-CM

## 2023-09-20 DIAGNOSIS — M059 Rheumatoid arthritis with rheumatoid factor, unspecified: Secondary | ICD-10-CM | POA: Diagnosis not present

## 2023-09-20 DIAGNOSIS — L84 Corns and callosities: Secondary | ICD-10-CM

## 2023-09-20 DIAGNOSIS — M2242 Chondromalacia patellae, left knee: Secondary | ICD-10-CM

## 2023-09-20 DIAGNOSIS — H918X3 Other specified hearing loss, bilateral: Secondary | ICD-10-CM

## 2023-09-20 DIAGNOSIS — M19072 Primary osteoarthritis, left ankle and foot: Secondary | ICD-10-CM

## 2023-09-20 DIAGNOSIS — M24522 Contracture, left elbow: Secondary | ICD-10-CM

## 2023-09-20 DIAGNOSIS — E785 Hyperlipidemia, unspecified: Secondary | ICD-10-CM

## 2023-09-20 DIAGNOSIS — M19071 Primary osteoarthritis, right ankle and foot: Secondary | ICD-10-CM

## 2023-09-20 DIAGNOSIS — Z8639 Personal history of other endocrine, nutritional and metabolic disease: Secondary | ICD-10-CM

## 2023-09-20 DIAGNOSIS — N400 Enlarged prostate without lower urinary tract symptoms: Secondary | ICD-10-CM

## 2023-09-20 MED ORDER — METHOTREXATE SODIUM 2.5 MG PO TABS: 10.0000 mg | ORAL_TABLET | ORAL | Status: DC

## 2023-09-20 NOTE — Addendum Note (Signed)
 Addended by: Pollyann Savoy on: 09/20/2023 03:30 PM   Modules accepted: Orders

## 2023-09-20 NOTE — Patient Instructions (Addendum)
  Reduce dose of methotrexate  to 2.5 mg tablet, 4 tablets by mouth weekly please take folic acid  1 mg tablet 1 tablet daily  Standing Labs We placed an order today for your standing lab work.   Please have your standing labs drawn in April and every 3 months  Please have your labs drawn 2 weeks prior to your appointment so that the provider can discuss your lab results at your appointment, if possible.  Please note that you may see your imaging and lab results in MyChart before we have reviewed them. We will contact you once all results are reviewed. Please allow our office up to 72 hours to thoroughly review all of the results before contacting the office for clarification of your results.  WALK-IN LAB HOURS  Monday through Thursday from 8:00 am -12:30 pm and 1:00 pm-5:00 pm and Friday from 8:00 am-12:00 pm.  Patients with office visits requiring labs will be seen before walk-in labs.  You may encounter longer than normal wait times. Please allow additional time. Wait times may be shorter on  Monday and Thursday afternoons.  We do not book appointments for walk-in labs. We appreciate your patience and understanding with our staff.   Labs are drawn by [facility]. Please bring your co-pay at the time of your lab draw.  You may receive a bill from [facility] for your lab work.  Please note if you are on Hydroxychloroquine and and an order has been placed for a Hydroxychloroquine level,  you will need to have it drawn 4 hours or more after your last dose.  If you wish to have your labs drawn at another location, please call the office 24 hours in advance so we can fax the orders.  The office is located at [address], [address], [place], [place] [address]   If you have any questions regarding directions or hours of operation,  please call [phone].   As a reminder, please drink plenty of water prior to coming for your lab work. Thanks!   Vaccines You are taking a medication(s) that can  suppress your immune system.  The following immunizations are recommended: Flu annually Covid-19  RSV Td/Tdap (tetanus, diphtheria, pertussis) every 10 years Pneumonia (Prevnar 15 then Pneumovax 23 at least 1 year apart.  Alternatively, can take Prevnar 20 without needing additional dose) Shingrix: 2 doses from 4 weeks to 6 months apart  Please check with your PCP to make sure you are up to date.   If you have signs or symptoms of an infection or start antibiotics: First, call your PCP for workup of your infection. Hold your medication through the infection, until you complete your antibiotics, and until symptoms resolve if you take the following: Injectable medication (Actemra, Benlysta, Cimzia, Cosentyx, Enbrel, Humira, Kevzara, Orencia, Remicade, Simponi, Stelara, Taltz, Tremfya) Methotrexate  Leflunomide (Arava) Mycophenolate (Cellcept) Xeljanz, Olumiant, or Rinvoq

## 2023-09-21 LAB — COMPLETE METABOLIC PANEL WITH GFR
AG Ratio: 1.8 (calc) (ref 1.0–2.5)
ALT: 14 U/L (ref 9–46)
AST: 14 U/L (ref 10–35)
Albumin: 4.6 g/dL (ref 3.6–5.1)
Alkaline phosphatase (APISO): 47 U/L (ref 35–144)
BUN: 19 mg/dL (ref 7–25)
CO2: 29 mmol/L (ref 20–32)
Calcium: 9.7 mg/dL (ref 8.6–10.3)
Chloride: 106 mmol/L (ref 98–110)
Creat: 0.86 mg/dL (ref 0.70–1.30)
Globulin: 2.5 g/dL (ref 1.9–3.7)
Glucose, Bld: 86 mg/dL (ref 65–99)
Potassium: 4.3 mmol/L (ref 3.5–5.3)
Sodium: 142 mmol/L (ref 135–146)
Total Bilirubin: 0.5 mg/dL (ref 0.2–1.2)
Total Protein: 7.1 g/dL (ref 6.1–8.1)
eGFR: 102 mL/min/{1.73_m2} (ref >=60)

## 2023-09-21 LAB — CBC WITH DIFFERENTIAL/PLATELET
Absolute Lymphocytes: 1363 {cells}/uL (ref 850–3900)
Absolute Monocytes: 595 {cells}/uL (ref 200–950)
Basophils Absolute: 51 {cells}/uL (ref 0–200)
Basophils Relative: 0.8 %
Eosinophils Absolute: 90 {cells}/uL (ref 15–500)
Eosinophils Relative: 1.4 %
HCT: 42.3 % (ref 38.5–50.0)
Hemoglobin: 14.3 g/dL (ref 13.2–17.1)
MCH: 29.6 pg (ref 27.0–33.0)
MCHC: 33.8 g/dL (ref 32.0–36.0)
MCV: 87.6 fL (ref 80.0–100.0)
MPV: 10.9 fL (ref 7.5–12.5)
Monocytes Relative: 9.3 %
Neutro Abs: 4301 {cells}/uL (ref 1500–7800)
Neutrophils Relative %: 67.2 %
Platelets: 252 10*3/uL (ref 140–400)
RBC: 4.83 10*6/uL (ref 4.20–5.80)
RDW: 13.6 % (ref 11.0–15.0)
Total Lymphocyte: 21.3 %
WBC: 6.4 10*3/uL (ref 3.8–10.8)

## 2023-09-21 LAB — LIPID PANEL
Cholesterol: 252 mg/dL — ABNORMAL HIGH (ref <200)
HDL: 67 mg/dL (ref >=40)
LDL Cholesterol (Calc): 164 mg/dL — ABNORMAL HIGH
Non-HDL Cholesterol (Calc): 185 mg/dL — ABNORMAL HIGH (ref <130)
Total CHOL/HDL Ratio: 3.8 (calc) (ref <5.0)
Triglycerides: 101 mg/dL (ref <150)

## 2023-09-21 NOTE — Progress Notes (Signed)
 CBC and CMP are normal.  LDL is elevated.  Please notify patient and forward results to his PCP.

## 2023-10-05 ENCOUNTER — Other Ambulatory Visit: Payer: Self-pay | Admitting: Physician Assistant

## 2023-10-05 DIAGNOSIS — M059 Rheumatoid arthritis with rheumatoid factor, unspecified: Secondary | ICD-10-CM

## 2023-10-05 NOTE — Telephone Encounter (Signed)
Last Fill: 08/19/2022  Labs: 09/20/2023 CBC and CMP are normal.     Next Visit: 02/21/2024  Last Visit: 09/20/2023  DX: Seropositive rheumatoid arthritis   Current Dose per office note 09/20/2023:  He would like to reduce the dose of methotrexate further. We discussed decreasing methotrexate to 4 tablets p.o. weekly.   Okay to refill Methotrexate?

## 2023-10-12 ENCOUNTER — Telehealth: Payer: Self-pay | Admitting: *Deleted

## 2023-10-12 NOTE — Telephone Encounter (Signed)
Received a fax from [facility] for a therapeutic alert.   An analysis of prescription claims suggest the patient may be receiving MTX without folic acid supplementation.   Reviewed by Sherron Ales, PA-C: Please clarify if patient is taking Folic Acid?  Attempted to contact the patient and left message for patient to call the office.

## 2023-10-14 NOTE — Telephone Encounter (Signed)
Patient returned call to the office and states he is taking the Folic Acid daily as instructed. Patient's dose of MTX has decreased so his Folic Acid decreased and he had an abundance of Folic Acid at home.

## 2023-10-14 NOTE — Telephone Encounter (Signed)
 Attempted to contact the patient and left message for patient to call the office.

## 2024-01-01 ENCOUNTER — Other Ambulatory Visit: Payer: Self-pay | Admitting: Physician Assistant

## 2024-01-01 DIAGNOSIS — Z79899 Other long term (current) drug therapy: Secondary | ICD-10-CM

## 2024-01-01 DIAGNOSIS — M059 Rheumatoid arthritis with rheumatoid factor, unspecified: Secondary | ICD-10-CM

## 2024-01-02 NOTE — Telephone Encounter (Signed)
 Last Fill: 10/05/2023  Labs: 09/20/2023 CBC and CMP are normal.   Next Visit: 02/21/2024  Last Visit: 09/20/2023  DX:  Seropositive rheumatoid arthritis   Current Dose per office note 09/20/2023: He would like to reduce the dose of methotrexate  further. We discussed decreasing methotrexate  to 4 tablets p.o. weekly.   Patient advised he is due to update labs. Patient states he will get his lab work within the next week.   Okay to refill Methotrexate ?

## 2024-01-20 ENCOUNTER — Other Ambulatory Visit: Payer: Self-pay | Admitting: *Deleted

## 2024-01-20 DIAGNOSIS — Z79899 Other long term (current) drug therapy: Secondary | ICD-10-CM

## 2024-01-20 LAB — COMPREHENSIVE METABOLIC PANEL WITH GFR
AG Ratio: 2.1 (calc) (ref 1.0–2.5)
ALT: 14 U/L (ref 9–46)
AST: 12 U/L (ref 10–35)
Albumin: 4.5 g/dL (ref 3.6–5.1)
Alkaline phosphatase (APISO): 42 U/L (ref 35–144)
BUN: 17 mg/dL (ref 7–25)
CO2: 31 mmol/L (ref 20–32)
Calcium: 9.3 mg/dL (ref 8.6–10.3)
Chloride: 105 mmol/L (ref 98–110)
Creat: 0.87 mg/dL (ref 0.70–1.30)
Globulin: 2.1 g/dL (ref 1.9–3.7)
Glucose, Bld: 106 mg/dL — ABNORMAL HIGH (ref 65–99)
Potassium: 4.5 mmol/L (ref 3.5–5.3)
Sodium: 142 mmol/L (ref 135–146)
Total Bilirubin: 0.5 mg/dL (ref 0.2–1.2)
Total Protein: 6.6 g/dL (ref 6.1–8.1)
eGFR: 101 mL/min/{1.73_m2} (ref >=60)

## 2024-01-20 LAB — CBC WITH DIFFERENTIAL/PLATELET
Absolute Lymphocytes: 1233 {cells}/uL (ref 850–3900)
Absolute Monocytes: 419 {cells}/uL (ref 200–950)
Basophils Absolute: 59 {cells}/uL (ref 0–200)
Basophils Relative: 1 %
Eosinophils Absolute: 159 {cells}/uL (ref 15–500)
Eosinophils Relative: 2.7 %
HCT: 43.9 % (ref 38.5–50.0)
Hemoglobin: 14.5 g/dL (ref 13.2–17.1)
MCH: 28.8 pg (ref 27.0–33.0)
MCHC: 33 g/dL (ref 32.0–36.0)
MCV: 87.1 fL (ref 80.0–100.0)
MPV: 11.2 fL (ref 7.5–12.5)
Monocytes Relative: 7.1 %
Neutro Abs: 4030 {cells}/uL (ref 1500–7800)
Neutrophils Relative %: 68.3 %
Platelets: 205 10*3/uL (ref 140–400)
RBC: 5.04 10*6/uL (ref 4.20–5.80)
RDW: 13.7 % (ref 11.0–15.0)
Total Lymphocyte: 20.9 %
WBC: 5.9 10*3/uL (ref 3.8–10.8)

## 2024-01-22 NOTE — Progress Notes (Signed)
 CBC and CMP WNL

## 2024-01-30 ENCOUNTER — Other Ambulatory Visit: Payer: Self-pay | Admitting: Physician Assistant

## 2024-01-30 DIAGNOSIS — M059 Rheumatoid arthritis with rheumatoid factor, unspecified: Secondary | ICD-10-CM

## 2024-01-30 NOTE — Telephone Encounter (Signed)
 Last Fill: 01/02/2024 (30 day supply)  Labs: 01/20/2024 CBC and CMP WNL   Next Visit: 02/21/2024  Last Visit: 09/20/2023  DX: Seropositive rheumatoid arthritis   Current Dose per office note 09/20/2023: He would like to reduce the dose of methotrexate  further. We discussed decreasing methotrexate  to 4 tablets p.o. weekly.   Okay to refill Methotrexate ?

## 2024-02-08 NOTE — Progress Notes (Deleted)
 Office Visit Note  Patient: Christian Gentry             Date of Birth: 01/22/1966           MRN: [id number]             PCP: Lory Rough., PA-C Referring: Lory Rough., PA-C Visit Date: 02/21/2024 Occupation: @GUAROCC @  Subjective:  No chief complaint on file.   History of Present Illness: Christian Gentry is a 58 y.o. male ***     Activities of Daily Living:  Patient reports morning stiffness for *** {minute/hour:19697}.   Patient {ACTIONS;DENIES/REPORTS:21021675::"Denies"} nocturnal pain.  Difficulty dressing/grooming: {ACTIONS;DENIES/REPORTS:21021675::"Denies"} Difficulty climbing stairs: {ACTIONS;DENIES/REPORTS:21021675::"Denies"} Difficulty getting out of chair: {ACTIONS;DENIES/REPORTS:21021675::"Denies"} Difficulty using hands for taps, buttons, cutlery, and/or writing: {ACTIONS;DENIES/REPORTS:21021675::"Denies"}  No Rheumatology ROS completed.   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   Plantar fasciitis 09/03/2016   Dyslipidemia 09/03/2016   Goiter 09/03/2016   Bilateral hearing loss 09/03/2016   History of neutropenia 09/03/2016    Past Medical History:  Diagnosis Date   Hearing difficulty    Hypercholesteremia    Rash    Rheumatoid arthritis (HCC)    Thyroid  disease     Family History  Problem Relation Age of Onset   Cancer Father        prostate,lung   Prostate cancer Brother    Cancer Other    Seizures Other    Thyroid  disease Other    Past Surgical History:  Procedure Laterality Date   PROSTATE BIOPSY  06/2021   PROSTATE BIOPSY     02/2023   THYROID  LOBECTOMY  10/21/2011    THYROID  LOBECTOMY  10/21/2011   Procedure: THYROID  LOBECTOMY;  Surgeon: Darcella Earnest, MD;  Location: [facility];  Service: General;  Laterality: Left;  Removal left lobe of thyroid    Social History   Social History Narrative   Not on file    There is no immunization history on file for this patient.   Objective: Vital Signs: There were no vitals taken for this visit.   Physical Exam   Musculoskeletal Exam: ***  CDAI Exam: CDAI Score: -- Patient Global: --; Provider Global: -- Swollen: --; Tender: -- Joint Exam 02/21/2024   No joint exam has been documented for this visit   There is currently no information documented on the homunculus. Go to the Rheumatology activity and complete the homunculus joint exam.  Investigation: No additional findings.  Imaging: No results found.  Recent Labs: Lab Results  Component Value Date   WBC 5.9 01/20/2024   HGB 14.5 01/20/2024   PLT 205 01/20/2024   NA 142 01/20/2024   K 4.5 01/20/2024   CL 105 01/20/2024   CO2 31 01/20/2024   GLUCOSE 106 (H) 01/20/2024   BUN 17 01/20/2024   CREATININE 0.87 01/20/2024   BILITOT 0.5 01/20/2024   ALKPHOS 35 (L) 02/04/2017   AST 12 01/20/2024   ALT 14 01/20/2024   PROT 6.6 01/20/2024   ALBUMIN 4.1 02/04/2017   CALCIUM 9.3 01/20/2024   GFRAA 115 12/05/2020    Speciality Comments: PLQ EYE Exam: 09/07/18 WNL @  [facility] follow up  1 year  Procedures:  No procedures performed Allergies: Patient has no known allergies.   Assessment / Plan:     Visit Diagnoses: No diagnosis found.  Orders: No orders of the defined types were placed in this encounter.  No orders of the defined types were placed in this encounter.   Face-to-face time spent with patient was *** minutes. Greater than 50% of time was spent in counseling and coordination of care.  Follow-Up Instructions: No follow-ups on file.   Dee Farber, CMA  Note - This record has been created using Animal nutritionist.   Chart creation errors have been sought, but may not always  have been located. Such creation errors do not reflect on  the standard of medical care.

## 2024-02-12 ENCOUNTER — Other Ambulatory Visit: Payer: Self-pay | Admitting: Physician Assistant

## 2024-02-13 NOTE — Progress Notes (Deleted)
 Office Visit Note  Patient: Christian Gentry             Date of Birth: June 04, 1966           MRN: [id number]             PCP: Lory Rough., PA-C Referring: Lory Rough., PA-C Visit Date: 02/23/2024 Occupation: @GUAROCC @  Subjective:  No chief complaint on file.   History of Present Illness: Christian Cinch Ormond is a 58 y.o. male ***     Activities of Daily Living:  Patient reports morning stiffness for *** {minute/hour:19697}.   Patient {ACTIONS;DENIES/REPORTS:21021675::"Denies"} nocturnal pain.  Difficulty dressing/grooming: {ACTIONS;DENIES/REPORTS:21021675::"Denies"} Difficulty climbing stairs: {ACTIONS;DENIES/REPORTS:21021675::"Denies"} Difficulty getting out of chair: {ACTIONS;DENIES/REPORTS:21021675::"Denies"} Difficulty using hands for taps, buttons, cutlery, and/or writing: {ACTIONS;DENIES/REPORTS:21021675::"Denies"}  No Rheumatology ROS completed.   PMFS History:  Patient Active Problem List   Diagnosis Date Noted  . 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 Seropositive rheumatoid arthritis (HCC) 09/03/2016  . High risk medication use 09/03/2016  . Primary osteoarthritis of both hands 09/03/2016  . Primary osteoarthritis of both feet 09/03/2016  . Flexion contracture of elbow 09/03/2016  . 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 Plantar fasciitis 09/03/2016  . Dyslipidemia 09/03/2016  . Goiter 09/03/2016  . Bilateral hearing loss 09/03/2016  . History of neutropenia 09/03/2016    Past Medical History:  Diagnosis Date  . Hearing difficulty   . Hypercholesteremia   . Rash   . Rheumatoid arthritis (HCC)   . Thyroid  disease     Family History  Problem Relation Age of Onset  . Cancer Father        prostate,lung  . Prostate cancer Brother   . Cancer Other   . Seizures Other   . Thyroid  disease Other    Past Surgical History:  Procedure Laterality Date  . PROSTATE BIOPSY  06/2021  . PROSTATE BIOPSY     02/2023  . THYROID   LOBECTOMY  10/21/2011  . THYROID  LOBECTOMY  10/21/2011   Procedure: THYROID  LOBECTOMY;  Surgeon: Darcella Earnest, MD;  Location: [facility];  Service: General;  Laterality: Left;  Removal left lobe of thyroid    Social History   Social History Narrative  . Not on file    There is no immunization history on file for this patient.   Objective: Vital Signs: There were no vitals taken for this visit.   Physical Exam   Musculoskeletal Exam: ***  CDAI Exam: CDAI Score: -- Patient Global: --; Provider Global: -- Swollen: --; Tender: -- Joint Exam 02/23/2024   No joint exam has been documented for this visit   There is currently no information documented on the homunculus. Go to the Rheumatology activity and complete the homunculus joint exam.  Investigation: No additional findings.  Imaging: No results found.  Recent Labs: Lab Results  Component Value Date   WBC 5.9 01/20/2024   HGB 14.5 01/20/2024   PLT 205 01/20/2024   NA 142 01/20/2024   K 4.5 01/20/2024   CL 105 01/20/2024   CO2 31 01/20/2024   GLUCOSE 106 (H) 01/20/2024   BUN 17 01/20/2024   CREATININE 0.87 01/20/2024   BILITOT 0.5 01/20/2024   ALKPHOS 35 (L) 02/04/2017   AST 12 01/20/2024   ALT 14 01/20/2024   PROT 6.6 01/20/2024   ALBUMIN 4.1 02/04/2017   CALCIUM 9.3 01/20/2024   GFRAA 115 12/05/2020    Speciality Comments: PLQ EYE Exam: 09/07/18 WNL @  [facility] follow up  1 year  Procedures:  No procedures performed Allergies: Patient has no known allergies.   Assessment / Plan:     Visit Diagnoses: No diagnosis found.  Orders: No orders of the defined types were placed in this encounter.  No orders of the defined types were placed in this encounter.   Face-to-face time spent with patient was *** minutes. Greater than 50% of time was spent in counseling and coordination of care.  Follow-Up Instructions: No follow-ups on file.   Dee Farber, CMA  Note - This record has been created  using Animal nutritionist.  Chart creation errors have been sought, but may not always  have been located. Such creation errors do not reflect on  the standard of medical care.

## 2024-02-13 NOTE — Telephone Encounter (Signed)
 Last Fill: 08/19/2022  Next Visit: 02/23/2024  Last Visit: 09/20/2023  Dx: Seropositive rheumatoid arthritis (HCC)   Current Dose per office note on 09/20/2023: folic acid  1 mg p.o. daily   Original prescription for folic acid  from 08/19/2022 states the patient is taking 2 tablets daily. Per last office note, the patient is taking 1 tablet daily. Prescription changed to reflect one tablet daily. Please change is needed.   Okay to refill Folic Acid ?

## 2024-02-21 ENCOUNTER — Ambulatory Visit: Payer: 59 | Admitting: Rheumatology

## 2024-02-21 DIAGNOSIS — E785 Hyperlipidemia, unspecified: Secondary | ICD-10-CM

## 2024-02-21 DIAGNOSIS — M19041 Primary osteoarthritis, right hand: Secondary | ICD-10-CM

## 2024-02-21 DIAGNOSIS — N400 Enlarged prostate without lower urinary tract symptoms: Secondary | ICD-10-CM

## 2024-02-21 DIAGNOSIS — M24521 Contracture, right elbow: Secondary | ICD-10-CM

## 2024-02-21 DIAGNOSIS — Z8639 Personal history of other endocrine, nutritional and metabolic disease: Secondary | ICD-10-CM

## 2024-02-21 DIAGNOSIS — M059 Rheumatoid arthritis with rheumatoid factor, unspecified: Secondary | ICD-10-CM

## 2024-02-21 DIAGNOSIS — E049 Nontoxic goiter, unspecified: Secondary | ICD-10-CM

## 2024-02-21 DIAGNOSIS — L84 Corns and callosities: Secondary | ICD-10-CM

## 2024-02-21 DIAGNOSIS — H918X3 Other specified hearing loss, bilateral: Secondary | ICD-10-CM

## 2024-02-21 DIAGNOSIS — M2242 Chondromalacia patellae, left knee: Secondary | ICD-10-CM

## 2024-02-21 DIAGNOSIS — Z79899 Other long term (current) drug therapy: Secondary | ICD-10-CM

## 2024-02-21 DIAGNOSIS — M19071 Primary osteoarthritis, right ankle and foot: Secondary | ICD-10-CM

## 2024-02-23 ENCOUNTER — Ambulatory Visit: Admitting: Rheumatology

## 2024-02-23 DIAGNOSIS — Z79899 Other long term (current) drug therapy: Secondary | ICD-10-CM

## 2024-02-23 DIAGNOSIS — M19072 Primary osteoarthritis, left ankle and foot: Secondary | ICD-10-CM

## 2024-02-23 DIAGNOSIS — M059 Rheumatoid arthritis with rheumatoid factor, unspecified: Secondary | ICD-10-CM

## 2024-02-23 DIAGNOSIS — M19041 Primary osteoarthritis, right hand: Secondary | ICD-10-CM

## 2024-02-23 DIAGNOSIS — L84 Corns and callosities: Secondary | ICD-10-CM

## 2024-02-23 DIAGNOSIS — E785 Hyperlipidemia, unspecified: Secondary | ICD-10-CM

## 2024-02-23 DIAGNOSIS — M2242 Chondromalacia patellae, left knee: Secondary | ICD-10-CM

## 2024-02-23 DIAGNOSIS — Z8639 Personal history of other endocrine, nutritional and metabolic disease: Secondary | ICD-10-CM

## 2024-02-23 DIAGNOSIS — N400 Enlarged prostate without lower urinary tract symptoms: Secondary | ICD-10-CM

## 2024-02-23 DIAGNOSIS — E049 Nontoxic goiter, unspecified: Secondary | ICD-10-CM

## 2024-02-23 DIAGNOSIS — M24521 Contracture, right elbow: Secondary | ICD-10-CM

## 2024-02-23 DIAGNOSIS — H918X3 Other specified hearing loss, bilateral: Secondary | ICD-10-CM

## 2024-03-11 NOTE — Progress Notes (Signed)
 Office Visit Note  Patient: Christian Gentry             Date of Birth: 1965/09/30           MRN: [id number]             PCP: Debrah Josette ORN., PA-C Referring: Debrah Josette ORN., PA-C Visit Date: 03/23/2024 Occupation: @GUAROCC @  Subjective:  Hand stiffness  History of Present Illness: Christian Marquel Pottenger is a 58 y.o. male seropositive rheumatoid arthritis and osteoarthritis.  He returns today after his last visit in January 2025.  At that visit the dose of methotrexate  was decreased from 6 tablets p.o. weekly to 4 tablets p.o. weekly.  He states he has been taking methotrexate  on a regular basis along with folic acid .  He states recently has been having increasing stiffness and some discomfort in his hands.  He is not sure if it is due to the weather change or doing yard work.  He has not noticed any joint swelling.  None of the other joints are painful.    Activities of Daily Living:  Patient reports morning stiffness for 1 minutes.   Patient Denies nocturnal pain.  Difficulty dressing/grooming: Denies Difficulty climbing stairs: Denies Difficulty getting out of chair: Denies Difficulty using hands for taps, buttons, cutlery, and/or writing: Denies  Review of Systems  Constitutional:  Negative for fatigue.  HENT:  Negative for mouth sores and mouth dryness.   Eyes:  Positive for dryness.  Respiratory:  Negative for shortness of breath.   Cardiovascular:  Negative for chest pain and palpitations.  Gastrointestinal:  Negative for blood in stool, constipation and diarrhea.  Endocrine: Negative for increased urination.  Genitourinary:  Negative for involuntary urination.  Musculoskeletal:  Positive for morning stiffness. Negative for joint pain, gait problem, joint pain, joint swelling, myalgias, muscle weakness, muscle tenderness and myalgias.  Skin:  Negative for color change, rash, hair loss and sensitivity to sunlight.  Allergic/Immunologic: Negative for susceptible to  infections.  Neurological:  Positive for dizziness. Negative for headaches.  Hematological:  Negative for swollen glands.  Psychiatric/Behavioral:  Negative for depressed mood and sleep disturbance. The patient is not nervous/anxious.     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   Plantar fasciitis 09/03/2016   Dyslipidemia 09/03/2016   Goiter 09/03/2016   Bilateral hearing loss 09/03/2016   History of neutropenia 09/03/2016    Past Medical History:  Diagnosis Date   Hearing difficulty    Hypercholesteremia    Rash    Rheumatoid arthritis (HCC)    Thyroid  disease     Family History  Problem Relation Age of Onset   Cancer Father        prostate,lung   Prostate cancer Brother    Cancer Other    Seizures Other    Thyroid  disease Other    Past Surgical History:  Procedure Laterality Date   PROSTATE BIOPSY  06/2021   PROSTATE BIOPSY     02/2023   THYROID  LOBECTOMY  10/21/2011   THYROID  LOBECTOMY  10/21/2011   Procedure: THYROID  LOBECTOMY;  Surgeon: Sherlean JINNY Laughter, MD;  Location: [facility];  Service: General;  Laterality:  Left;  Removal left lobe of thyroid    Social History   Social History Narrative   Not on file    There is no immunization history on file for this patient.   Objective: Vital Signs: BP (!) 151/94 (BP Location: Left Arm, Patient Position: Sitting, Cuff Size: Normal)   Pulse 76   Resp 16   Ht 6' 2 (1.88 m)   Wt 224 lb (101.6 kg)   BMI 28.76 kg/m    Physical Exam Vitals and nursing note reviewed.  Constitutional:      Appearance: He is well-developed.  HENT:     Head: Normocephalic and  atraumatic.  Eyes:     Conjunctiva/sclera: Conjunctivae normal.     Pupils: Pupils are equal, round, and reactive to light.  Cardiovascular:     Rate and Rhythm: Normal rate and regular rhythm.     Heart sounds: Normal heart sounds.  Pulmonary:     Effort: Pulmonary effort is normal.     Breath sounds: Normal breath sounds.  Abdominal:     General: Bowel sounds are normal.     Palpations: Abdomen is soft.  Musculoskeletal:     Cervical back: Normal range of motion and neck supple.  Skin:    General: Skin is warm and dry.     Capillary Refill: Capillary refill takes less than 2 seconds.  Neurological:     Mental Status: He is alert and oriented to person, place, and time.  Psychiatric:        Behavior: Behavior normal.      Musculoskeletal Exam: Cervical, thoracic and lumbar spine were in good range of motion.  Shoulder joints, elbow joints, wrist joints, MCPs PIPs and DIPs with good range of motion with no synovitis.  Mild PIP and DIP thickening was noted.  Hip joints and knee joints in good range of motion.  There was no tenderness over ankles or MTPs.  He had thickening of PIP and DIP joints.  Callus formation was noted under his first metatarsals.  CDAI Exam: CDAI Score: -- Patient Global: --; Provider Global: -- Swollen: --; Tender: -- Joint Exam 03/23/2024   No joint exam has been documented for this visit   There is currently no information documented on the homunculus. Go to the Rheumatology activity and complete the homunculus joint exam.  Investigation: No additional findings.  Imaging: No results found.  Recent Labs: Lab Results  Component Value Date   WBC 5.9 01/20/2024   HGB 14.5 01/20/2024   PLT 205 01/20/2024   NA 142 01/20/2024   K 4.5 01/20/2024   CL 105 01/20/2024   CO2 31 01/20/2024   GLUCOSE 106 (H) 01/20/2024   BUN 17 01/20/2024   CREATININE 0.87 01/20/2024   BILITOT 0.5 01/20/2024   ALKPHOS 35 (L) 02/04/2017   AST 12 01/20/2024   ALT 14  01/20/2024   PROT 6.6 01/20/2024   ALBUMIN 4.1 02/04/2017   CALCIUM 9.3 01/20/2024   GFRAA 115 12/05/2020    Speciality Comments: PLQ EYE Exam: 09/07/18 WNL @  [facility] follow up 1 year  Procedures:  No procedures performed Allergies: Patient has no known allergies.   Assessment / Plan:     Visit Diagnoses: Seropositive rheumatoid arthritis (HCC) - Positive RF, positive anti-CCP, history of severe synovitis -patient denies any joint swelling.  He has been having increasing stiffness in his hands.  He relates it to gardening and weather change.  No synovitis was noted on the examination today.  The dose of methotrexate  was decreased from 6 tablets p.o. weekly to 4 tablets p.o. weekly in January.  Plan: Sedimentation rate with next labs.  High risk medication use - Methotrexate  4 tablets p.o. weekly with folic acid  1 mg p.o. daily.  Jan 20, 2024 CBC and CMP were normal.  He was advised to get labs in August and every 3 months.  Information minimization was placed in the AVS.  He was advised to hold methotrexate  if he develops an infection resume after infection resolves.  Primary osteoarthritis of both hands-he has bilateral PIP and DIP thickening.  Joint protection muscle strengthening was discussed.  Contracture of joint of both elbows-unchanged without synovitis.  Chondromalacia of both patellae-he denies any discomfort today.  Primary osteoarthritis of both feet-he continues to have some stiffness in his feet.  Callus under metatarsal head  Elevated blood pressure reading-blood pressure was elevated at 151/94.  Repeated blood pressure was 166/120.  Repeat blood pressure was 184/119.  Blood pressure was also elevated at the last visit.  We called [facility].  Patient is going there for the treatment of hypertension today right after this appointment.  I advised him to schedule an appointment with his PCP for the treatment of hypertension and  hyperlipidemia.  Dyslipidemia-his LDL was 164 in January 2025.  He was advised to follow-up with his PCP regarding dyslipidemia.  Dietary modifications and exercise was discussed.  Enlarged prostate-patient states his biopsy was negative but his PSA remains elevated.  Goiter  Orders: Orders Placed This Encounter  Procedures   Sedimentation rate   No orders of the defined types were placed in this encounter.    Follow-Up Instructions: Return in about 5 months (around 08/23/2024) for Rheumatoid arthritis.   Maya Nash, MD  Note - This record has been created using Animal nutritionist.  Chart creation errors have been sought, but may not always  have been located. Such creation errors do not reflect on  the standard of medical care.

## 2024-03-23 ENCOUNTER — Encounter: Payer: Self-pay | Admitting: Rheumatology

## 2024-03-23 ENCOUNTER — Ambulatory Visit: Attending: Rheumatology | Admitting: Rheumatology

## 2024-03-23 VITALS — BP 184/119 | HR 68 | Resp 16 | Ht 74.0 in | Wt 224.0 lb

## 2024-03-23 DIAGNOSIS — M19041 Primary osteoarthritis, right hand: Secondary | ICD-10-CM

## 2024-03-23 DIAGNOSIS — M24522 Contracture, left elbow: Secondary | ICD-10-CM

## 2024-03-23 DIAGNOSIS — E785 Hyperlipidemia, unspecified: Secondary | ICD-10-CM

## 2024-03-23 DIAGNOSIS — M059 Rheumatoid arthritis with rheumatoid factor, unspecified: Secondary | ICD-10-CM | POA: Diagnosis not present

## 2024-03-23 DIAGNOSIS — M2241 Chondromalacia patellae, right knee: Secondary | ICD-10-CM

## 2024-03-23 DIAGNOSIS — M2242 Chondromalacia patellae, left knee: Secondary | ICD-10-CM

## 2024-03-23 DIAGNOSIS — Z79899 Other long term (current) drug therapy: Secondary | ICD-10-CM | POA: Diagnosis not present

## 2024-03-23 DIAGNOSIS — L84 Corns and callosities: Secondary | ICD-10-CM

## 2024-03-23 DIAGNOSIS — M19072 Primary osteoarthritis, left ankle and foot: Secondary | ICD-10-CM

## 2024-03-23 DIAGNOSIS — M24521 Contracture, right elbow: Secondary | ICD-10-CM | POA: Diagnosis not present

## 2024-03-23 DIAGNOSIS — M19042 Primary osteoarthritis, left hand: Secondary | ICD-10-CM

## 2024-03-23 DIAGNOSIS — N400 Enlarged prostate without lower urinary tract symptoms: Secondary | ICD-10-CM

## 2024-03-23 DIAGNOSIS — R030 Elevated blood-pressure reading, without diagnosis of hypertension: Secondary | ICD-10-CM

## 2024-03-23 DIAGNOSIS — E049 Nontoxic goiter, unspecified: Secondary | ICD-10-CM

## 2024-03-23 DIAGNOSIS — Z8639 Personal history of other endocrine, nutritional and metabolic disease: Secondary | ICD-10-CM

## 2024-03-23 DIAGNOSIS — M19071 Primary osteoarthritis, right ankle and foot: Secondary | ICD-10-CM

## 2024-03-23 NOTE — Patient Instructions (Addendum)
 Standing Labs We placed an order today for your standing lab work.   Please have your standing labs drawn in August and every 3 months  Please have your labs drawn 2 weeks prior to your appointment so that the provider can discuss your lab results at your appointment, if possible.  Please note that you may see your imaging and lab results in MyChart before we have reviewed them. We will contact you once all results are reviewed. Please allow our office up to 72 hours to thoroughly review all of the results before contacting the office for clarification of your results.  WALK-IN LAB HOURS  Monday through Thursday from 8:00 am -12:30 pm and 1:00 pm-4:30 pm and Friday from 8:00 am-12:00 pm.  Patients with office visits requiring labs will be seen before walk-in labs.  You may encounter longer than normal wait times. Please allow additional time. Wait times may be shorter on  Monday and Thursday afternoons.  We do not book appointments for walk-in labs. We appreciate your patience and understanding with our staff.   Labs are drawn by [facility]. Please bring your co-pay at the time of your lab draw.  You may receive a bill from [facility] for your lab work.  Please note if you are on Hydroxychloroquine and and an order has been placed for a Hydroxychloroquine level,  you will need to have it drawn 4 hours or more after your last dose.  If you wish to have your labs drawn at another location, please call the office 24 hours in advance so we can fax the orders.  The office is located at [address], [address], [place], [place] [address]   If you have any questions regarding directions or hours of operation,  please call [phone].   As a reminder, please drink plenty of water prior to coming for your lab work. Thanks!   Vaccines You are taking a medication(s) that can suppress your immune system.  The following immunizations are recommended: Flu annually Covid-19  Td/Tdap (tetanus,  diphtheria, pertussis) every 10 years Pneumonia (Prevnar 15 then Pneumovax 23 at least 1 year apart.  Alternatively, can take Prevnar 20 without needing additional dose) Shingrix: 2 doses from 4 weeks to 6 months apart  Please check with your PCP to make sure you are up to date.   If you have signs or symptoms of an infection or start antibiotics: First, call your PCP for workup of your infection. Hold your medication through the infection, until you complete your antibiotics, and until symptoms resolve if you take the following: Injectable medication (Actemra, Benlysta, Cimzia, Cosentyx, Enbrel, Humira, Kevzara, Orencia, Remicade, Simponi, Stelara, Taltz, Tremfya) Methotrexate  Leflunomide (Arava) Mycophenolate (Cellcept) Xeljanz, Olumiant, or Rinvoq

## 2024-04-22 ENCOUNTER — Other Ambulatory Visit: Payer: Self-pay | Admitting: Physician Assistant

## 2024-04-22 DIAGNOSIS — M059 Rheumatoid arthritis with rheumatoid factor, unspecified: Secondary | ICD-10-CM

## 2024-04-23 NOTE — Telephone Encounter (Signed)
 Last Fill: 01/30/2024  Labs: 01/20/2024 CBC and CMP WNL   Next Visit: 09/28/2024  Last Visit: 03/23/2024  DX: Seropositive rheumatoid arthritis   Current Dose per office note 03/23/2024: Methotrexate  4 tablets p.o. weekly   Left message to advise patient he is due to update labs.   Okay to refill Methotrexate ?

## 2024-04-24 ENCOUNTER — Other Ambulatory Visit: Payer: Self-pay | Admitting: *Deleted

## 2024-04-24 DIAGNOSIS — Z79899 Other long term (current) drug therapy: Secondary | ICD-10-CM

## 2024-04-24 DIAGNOSIS — M059 Rheumatoid arthritis with rheumatoid factor, unspecified: Secondary | ICD-10-CM

## 2024-04-25 ENCOUNTER — Ambulatory Visit: Payer: Self-pay | Admitting: Rheumatology

## 2024-04-25 ENCOUNTER — Ambulatory Visit: Payer: Self-pay | Admitting: Physician Assistant

## 2024-04-25 LAB — CBC WITH DIFFERENTIAL/PLATELET
Absolute Lymphocytes: 1381 {cells}/uL (ref 850–3900)
Absolute Monocytes: 517 {cells}/uL (ref 200–950)
Basophils Absolute: 39 {cells}/uL (ref 0–200)
Basophils Relative: 0.7 %
Eosinophils Absolute: 99 {cells}/uL (ref 15–500)
Eosinophils Relative: 1.8 %
HCT: 40 % (ref 38.5–50.0)
Hemoglobin: 13.3 g/dL (ref 13.2–17.1)
MCH: 29.5 pg (ref 27.0–33.0)
MCHC: 33.3 g/dL (ref 32.0–36.0)
MCV: 88.7 fL (ref 80.0–100.0)
MPV: 11.1 fL (ref 7.5–12.5)
Monocytes Relative: 9.4 %
Neutro Abs: 3465 {cells}/uL (ref 1500–7800)
Neutrophils Relative %: 63 %
Platelets: 215 Thousand/uL (ref 140–400)
RBC: 4.51 Million/uL (ref 4.20–5.80)
RDW: 13.6 % (ref 11.0–15.0)
Total Lymphocyte: 25.1 %
WBC: 5.5 Thousand/uL (ref 3.8–10.8)

## 2024-04-25 LAB — COMPREHENSIVE METABOLIC PANEL WITH GFR
AG Ratio: 2.1 (calc) (ref 1.0–2.5)
ALT: 15 U/L (ref 9–46)
AST: 13 U/L (ref 10–35)
Albumin: 4.5 g/dL (ref 3.6–5.1)
Alkaline phosphatase (APISO): 44 U/L (ref 35–144)
BUN: 22 mg/dL (ref 7–25)
CO2: 29 mmol/L (ref 20–32)
Calcium: 9.3 mg/dL (ref 8.6–10.3)
Chloride: 107 mmol/L (ref 98–110)
Creat: 0.88 mg/dL (ref 0.70–1.30)
Globulin: 2.1 g/dL (ref 1.9–3.7)
Glucose, Bld: 112 mg/dL — ABNORMAL HIGH (ref 65–99)
Potassium: 4.3 mmol/L (ref 3.5–5.3)
Sodium: 141 mmol/L (ref 135–146)
Total Bilirubin: 0.3 mg/dL (ref 0.2–1.2)
Total Protein: 6.6 g/dL (ref 6.1–8.1)
eGFR: 101 mL/min/1.73m2 (ref >=60)

## 2024-04-25 LAB — SEDIMENTATION RATE: Sed Rate: 11 mm/h (ref 0–20)

## 2024-04-25 NOTE — Progress Notes (Signed)
Glucose is 112. Rest of CMP WNL.  CBC WNL.

## 2024-04-25 NOTE — Progress Notes (Signed)
 Sed rate normal, CBC and CMP are normal except glucose is mildly elevated, probably not a fasting sample.

## 2024-07-14 ENCOUNTER — Other Ambulatory Visit: Payer: Self-pay | Admitting: Physician Assistant

## 2024-07-14 DIAGNOSIS — M059 Rheumatoid arthritis with rheumatoid factor, unspecified: Secondary | ICD-10-CM

## 2024-07-16 NOTE — Telephone Encounter (Signed)
 Last Fill: 04/23/2024  Labs: 04/24/2024 Glucose is 112. Rest of CMP WNL. CBC WNL   Next Visit: 09/28/2024  Last Visit: 03/23/2024  DX: Seropositive rheumatoid arthritis (HCC)   Current Dose per office note 03/23/2024: Methotrexate  4 tablets p.o. weekly   Okay to refill Methotrexate ?

## 2024-09-16 NOTE — Progress Notes (Signed)
 "  Office Visit Note  Patient: Christian Gentry             Date of Birth: [birth year]           MRN: [id number]             PCP: Debrah Josette ORN., PA-C Referring: Debrah Josette ORN., PA-C Visit Date: 09/28/2024 Occupation: Data Unavailable  Subjective:  Medication management  History of Present Illness: Christian Gentry is a 59 y.o. male with seropositive rheumatoid arthritis and osteoarthritis.  He returns today after his last visit in July 2025.  He states he has been doing well on methotrexate  4 tablets weekly without any side effects.  He has not had any flares of rheumatoid arthritis.  He has some stiffness in his hands which is not bothersome.  He states he was placed on blood pressure medication and his blood pressure is much better controlled now.  He has been also going to the [employer] times a week.  He states his blood pressure is much better when he goes to workout.    Activities of Daily Living:  Patient reports morning stiffness for 0 minutes.   Patient Denies nocturnal pain.  Difficulty dressing/grooming: Denies Difficulty climbing stairs: Denies Difficulty getting out of chair: Denies Difficulty using hands for taps, buttons, cutlery, and/or writing: Denies  Review of Systems  Constitutional:  Negative for fatigue.  HENT:  Negative for mouth sores and mouth dryness.   Eyes:  Positive for dryness.  Respiratory:  Negative for shortness of breath.   Cardiovascular:  Negative for chest pain and palpitations.  Gastrointestinal:  Negative for blood in stool, constipation and diarrhea.  Endocrine: Negative for increased urination.  Genitourinary:  Negative for involuntary urination.  Musculoskeletal:  Negative for joint pain, gait problem, joint pain, joint swelling, myalgias, muscle weakness, morning stiffness, muscle tenderness and myalgias.  Skin:  Negative for color change, rash, hair loss and sensitivity to sunlight.  Allergic/Immunologic: Negative for susceptible to  infections.  Neurological:  Negative for dizziness and headaches.  Hematological:  Negative for swollen glands.  Psychiatric/Behavioral:  Negative for depressed mood and sleep disturbance. The patient is not nervous/anxious.     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   Plantar fasciitis 09/03/2016   Dyslipidemia 09/03/2016   Goiter 09/03/2016   Bilateral hearing loss 09/03/2016   History of neutropenia 09/03/2016    Past Medical History:  Diagnosis Date   Hearing difficulty    Hypercholesteremia    Hypertension 2025   per patient   Rash    Rheumatoid arthritis (HCC)    Thyroid  disease     Family History  Problem Relation Age of Onset   Cancer Father        prostate,lung   Prostate cancer Brother    Cancer Other    Seizures Other    Thyroid  disease Other    Past Surgical History:  Procedure Laterality Date   PROSTATE BIOPSY  06/2021   PROSTATE BIOPSY     02/2023   THYROID  LOBECTOMY  10/21/2011   THYROID  LOBECTOMY  10/21/2011   Procedure: THYROID  LOBECTOMY;  Surgeon: Sherlean JINNY Laughter, MD;  Location: [facility];  Service: General;  Laterality: Left;  Removal left lobe of thyroid    Social History[1] Social History   Social History Narrative   Not on file      There is no immunization history on file for this patient.   Objective: Vital Signs: BP (!) 144/96   Pulse 65   Temp 97.6 F (36.4 C)   Resp 14   Ht 6' 2 (1.88 m)   Wt 223 lb 6.4 oz (101.3 kg)   BMI 28.68 kg/m    Physical Exam Vitals and nursing note reviewed.  Constitutional:      Appearance: He is well-developed.  HENT:     Head: Normocephalic  and atraumatic.  Eyes:     Conjunctiva/sclera: Conjunctivae normal.     Pupils: Pupils are equal, round, and reactive to light.     Comments: Pterygium noted in both eyes.  Mild conjunctival injection was noted.  Cardiovascular:     Rate and Rhythm: Normal rate and regular rhythm.     Heart sounds: Normal heart sounds.  Pulmonary:     Effort: Pulmonary effort is normal.     Breath sounds: Normal breath sounds.  Abdominal:     General: Bowel sounds are normal.     Palpations: Abdomen is soft.  Musculoskeletal:     Cervical back: Normal range of motion and neck supple.  Skin:    General: Skin is warm and dry.     Capillary Refill: Capillary refill takes less than 2 seconds.  Neurological:     Mental Status: He is alert and oriented to person, place, and time.  Psychiatric:        Behavior: Behavior normal.      Musculoskeletal Exam: Cervical, thoracic and lumbar spine were in good range of motion.  There was no SI joint tenderness.  Shoulder joints, elbow joints, wrist joints, MCPs, PIPs and DIPs were in good range of motion with no synovitis.  PIP and DIP thickening was noted.  No synovitis was noted.  Hip joints and knee joints were in good range of motion without any warmth swelling or effusion.  There was no tenderness over ankles or MTPs.  PIP and DIP thickening was noted.  CDAI Exam: CDAI Score: -- Patient Global: 10 / 100; Provider Global: 0 / 100 Swollen: --; Tender: -- Joint Exam 09/28/2024   No joint exam has been documented for this visit   There is currently no information documented on the homunculus. Go to the Rheumatology activity and complete the homunculus joint exam.  Investigation: No additional findings.  Imaging: No results found.  Recent Labs: Lab Results  Component Value Date   WBC 5.5 04/24/2024   HGB 13.3 04/24/2024   PLT 215 04/24/2024   NA 141 04/24/2024   K 4.3 04/24/2024   CL 107 04/24/2024   CO2 29 04/24/2024   GLUCOSE 112 (H)  04/24/2024   BUN 22 04/24/2024   CREATININE 0.88 04/24/2024   BILITOT 0.3 04/24/2024   ALKPHOS 35 (L) 02/04/2017   AST 13 04/24/2024   ALT 15 04/24/2024   PROT 6.6 04/24/2024   ALBUMIN 4.1 02/04/2017   CALCIUM 9.3 04/24/2024   GFRAA 115 12/05/2020    Speciality Comments: PLQ EYE Exam: 09/07/18 WNL @  [facility] follow up 1 year  Procedures:  No procedures performed Allergies: Patient has no known allergies.   Assessment / Plan:     Visit Diagnoses: Seropositive rheumatoid arthritis (HCC) - Positive RF, positive  anti-CCP, history of severe synovitis: Patient denies having a flare of rheumatoid arthritis since last visit.  No synovitis was noted on the examination.  He continues to have some stiffness.  He is on methotrexate  4 tablets p.o. weekly along with folic acid .  The dose of methotrexate  was decreased to 4 tablets p.o. weekly in January 2025.  High risk medication use - Methotrexate  4 tablets p.o. weekly with folic acid  1 mg p.o. daily.  CBC and CMP were normal in August 2025.  Will check labs today.  He was advised to get labs every 3 months to monitor for drug toxicity.  Information reimmunization was placed on the list.  He was advised to hold methotrexate  if he develops an infection resume after the infection resolves.  Primary osteoarthritis of both hands-bilateral PIP and DIP thickening was noted.  No synovitis was noted.  Joint protection was discussed.  Contracture of joint of both elbows-change with no synovitis.  Chondromalacia of both patellae-asymptomatic.  No warmth or swelling was noted.  Primary osteoarthritis of both feet-proper fitting shoes were advised.  Callus under metatarsal head-proper fitting shoes were advised.  Primary hypertension-blood pressure was 144/96.  Repeat blood pressure was 153/105.  His blood pressure has been consistently high for several months.  He states his blood pressure was better controlled when he was started on Benicar.  But  recently his blood pressure has been elevated.  I advised him to keep a log and discuss with his PCP.  Complications of chronic hypertension were discussed.  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  He denies any discomfort in his eyes.  He is not sure if his eyes are red due to allergies.  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  I advised him to see his ophthalmologist on a regular basis.  Other medical problems are listed as follows:  Dyslipidemia  Enlarged prostate  Goiter  Other specified hearing loss of both ears  Orders: No orders of the defined types were placed in this encounter.  No orders of the defined types were placed in this encounter.    Follow-Up Instructions: Return for Rheumatoid arthritis.   Maya Nash, MD  Note - This record has been created using Animal nutritionist.  Chart creation errors have been sought, but may not always  have been located. Such creation errors do not reflect on  the standard of medical care.     [1]  Social History Tobacco Use   Smoking status: Never    Passive exposure: Never   Smokeless tobacco: Never  Vaping Use   Vaping status: Never Used  Substance Use Topics   Alcohol use: Yes    Alcohol/week: 2.0 standard drinks of alcohol    Types: 2 Glasses of wine per week    Comment: occ   Drug use: No   "

## 2024-09-17 ENCOUNTER — Other Ambulatory Visit: Payer: Self-pay | Admitting: Urology

## 2024-09-17 DIAGNOSIS — R972 Elevated prostate specific antigen [PSA]: Secondary | ICD-10-CM

## 2024-09-28 ENCOUNTER — Encounter: Payer: Self-pay | Admitting: Rheumatology

## 2024-09-28 ENCOUNTER — Ambulatory Visit: Attending: Rheumatology | Admitting: Rheumatology

## 2024-09-28 VITALS — BP 153/105 | HR 59 | Temp 97.6°F | Resp 14 | Ht 74.0 in | Wt 223.4 lb

## 2024-09-28 DIAGNOSIS — M19071 Primary osteoarthritis, right ankle and foot: Secondary | ICD-10-CM | POA: Diagnosis not present

## 2024-09-28 DIAGNOSIS — M059 Rheumatoid arthritis with rheumatoid factor, unspecified: Secondary | ICD-10-CM

## 2024-09-28 DIAGNOSIS — H918X3 Other specified hearing loss, bilateral: Secondary | ICD-10-CM | POA: Diagnosis not present

## 2024-09-28 DIAGNOSIS — M24521 Contracture, right elbow: Secondary | ICD-10-CM

## 2024-09-28 DIAGNOSIS — M19041 Primary osteoarthritis, right hand: Secondary | ICD-10-CM | POA: Diagnosis not present

## 2024-09-28 DIAGNOSIS — M2241 Chondromalacia patellae, right knee: Secondary | ICD-10-CM

## 2024-09-28 DIAGNOSIS — Z79899 Other long term (current) drug therapy: Secondary | ICD-10-CM

## 2024-09-28 DIAGNOSIS — E785 Hyperlipidemia, unspecified: Secondary | ICD-10-CM | POA: Diagnosis not present

## 2024-09-28 DIAGNOSIS — N400 Enlarged prostate without lower urinary tract symptoms: Secondary | ICD-10-CM | POA: Diagnosis not present

## 2024-09-28 DIAGNOSIS — L84 Corns and callosities: Secondary | ICD-10-CM | POA: Diagnosis not present

## 2024-09-28 DIAGNOSIS — I10 Essential (primary) hypertension: Secondary | ICD-10-CM

## 2024-09-28 DIAGNOSIS — M19072 Primary osteoarthritis, left ankle and foot: Secondary | ICD-10-CM

## 2024-09-28 DIAGNOSIS — E049 Nontoxic goiter, unspecified: Secondary | ICD-10-CM

## 2024-09-28 DIAGNOSIS — M24522 Contracture, left elbow: Secondary | ICD-10-CM

## 2024-09-28 DIAGNOSIS — M19042 Primary osteoarthritis, left hand: Secondary | ICD-10-CM

## 2024-09-28 DIAGNOSIS — M2242 Chondromalacia patellae, left knee: Secondary | ICD-10-CM

## 2024-09-28 DIAGNOSIS — H11433 Conjunctival hyperemia, bilateral: Secondary | ICD-10-CM

## 2024-09-28 NOTE — Patient Instructions (Signed)

## 2024-09-29 LAB — COMPREHENSIVE METABOLIC PANEL WITH GFR
AG Ratio: 2.3 (calc) (ref 1.0–2.5)
ALT: 20 U/L (ref 9–46)
AST: 18 U/L (ref 10–35)
Albumin: 4.8 g/dL (ref 3.6–5.1)
Alkaline phosphatase (APISO): 38 U/L (ref 35–144)
BUN: 21 mg/dL (ref 7–25)
CO2: 27 mmol/L (ref 20–32)
Calcium: 9.3 mg/dL (ref 8.6–10.3)
Chloride: 107 mmol/L (ref 98–110)
Creat: 0.83 mg/dL (ref 0.70–1.30)
Globulin: 2.1 g/dL (ref 1.9–3.7)
Glucose, Bld: 100 mg/dL — ABNORMAL HIGH (ref 65–99)
Potassium: 4.5 mmol/L (ref 3.5–5.3)
Sodium: 142 mmol/L (ref 135–146)
Total Bilirubin: 0.4 mg/dL (ref 0.2–1.2)
Total Protein: 6.9 g/dL (ref 6.1–8.1)
eGFR: 102 mL/min/1.73m2

## 2024-09-29 LAB — CBC WITH DIFFERENTIAL/PLATELET
Absolute Lymphocytes: 1760 {cells}/uL (ref 850–3900)
Absolute Monocytes: 545 {cells}/uL (ref 200–950)
Basophils Absolute: 69 {cells}/uL (ref 0–200)
Basophils Relative: 1 %
Eosinophils Absolute: 90 {cells}/uL (ref 15–500)
Eosinophils Relative: 1.3 %
HCT: 43.4 % (ref 39.4–51.1)
Hemoglobin: 14.3 g/dL (ref 13.2–17.1)
MCH: 29.1 pg (ref 27.0–33.0)
MCHC: 32.9 g/dL (ref 31.6–35.4)
MCV: 88.2 fL (ref 81.4–101.7)
MPV: 11.4 fL (ref 7.5–12.5)
Monocytes Relative: 7.9 %
Neutro Abs: 4437 {cells}/uL (ref 1500–7800)
Neutrophils Relative %: 64.3 %
Platelets: 216 Thousand/uL (ref 140–400)
RBC: 4.92 Million/uL (ref 4.20–5.80)
RDW: 13.2 % (ref 11.0–15.0)
Total Lymphocyte: 25.5 %
WBC: 6.9 Thousand/uL (ref 3.8–10.8)

## 2024-09-30 ENCOUNTER — Ambulatory Visit: Payer: Self-pay | Admitting: Rheumatology

## 2024-10-02 ENCOUNTER — Other Ambulatory Visit: Payer: Self-pay | Admitting: Physician Assistant

## 2024-10-02 DIAGNOSIS — M059 Rheumatoid arthritis with rheumatoid factor, unspecified: Secondary | ICD-10-CM

## 2024-10-02 NOTE — Telephone Encounter (Signed)
 Last Fill: 07/16/2024  Labs: 09/28/2024 glucose 100  Next Visit: 03/01/2025  Last Visit: 09/28/2024  DX: Seropositive rheumatoid arthritis   Current Dose per office note on 09/28/2024: Methotrexate  4 tablets p.o. weekly   Okay to refill Methotrexate ?

## 2025-03-01 ENCOUNTER — Ambulatory Visit: Admitting: Rheumatology
